# Patient Record
Sex: Female | Born: 1995
Health system: Southern US, Community
[De-identification: ages and names within clinical notes are randomized; demographics above are authoritative.]

## PROBLEM LIST (undated history)

## (undated) DIAGNOSIS — F419 Anxiety disorder, unspecified: Secondary | ICD-10-CM

## (undated) DIAGNOSIS — Z8701 Personal history of pneumonia (recurrent): Secondary | ICD-10-CM

## (undated) DIAGNOSIS — T4145XA Adverse effect of unspecified anesthetic, initial encounter: Secondary | ICD-10-CM

## (undated) DIAGNOSIS — Z9889 Other specified postprocedural states: Secondary | ICD-10-CM

## (undated) DIAGNOSIS — T8859XA Other complications of anesthesia, initial encounter: Secondary | ICD-10-CM

## (undated) DIAGNOSIS — R112 Nausea with vomiting, unspecified: Secondary | ICD-10-CM

## (undated) HISTORY — PX: OTHER SURGICAL HISTORY: SHX169

## (undated) HISTORY — PX: MOUTH SURGERY: SHX715

## (undated) HISTORY — DX: Personal history of pneumonia (recurrent): Z87.01

---

## 2013-06-06 ENCOUNTER — Encounter (HOSPITAL_COMMUNITY): Payer: Self-pay | Admitting: Emergency Medicine

## 2013-06-06 ENCOUNTER — Emergency Department (HOSPITAL_COMMUNITY)
Admission: EM | Admit: 2013-06-06 | Discharge: 2013-06-06 | Disposition: A | Discharge status: Home / Self Care | Payer: Medicaid Other | Attending: Emergency Medicine | Admitting: Emergency Medicine

## 2013-06-06 DIAGNOSIS — Z8719 Personal history of other diseases of the digestive system: Secondary | ICD-10-CM | POA: Insufficient documentation

## 2013-06-06 DIAGNOSIS — Z3202 Encounter for pregnancy test, result negative: Secondary | ICD-10-CM | POA: Insufficient documentation

## 2013-06-06 DIAGNOSIS — B3731 Acute candidiasis of vulva and vagina: Secondary | ICD-10-CM | POA: Insufficient documentation

## 2013-06-06 DIAGNOSIS — Z792 Long term (current) use of antibiotics: Secondary | ICD-10-CM | POA: Insufficient documentation

## 2013-06-06 DIAGNOSIS — J029 Acute pharyngitis, unspecified: Secondary | ICD-10-CM | POA: Insufficient documentation

## 2013-06-06 DIAGNOSIS — B373 Candidiasis of vulva and vagina: Secondary | ICD-10-CM

## 2013-06-06 LAB — POCT PREGNANCY, URINE: Preg Test, Ur: NEGATIVE

## 2013-06-06 LAB — URINALYSIS, ROUTINE W REFLEX MICROSCOPIC
Glucose, UA: NEGATIVE mg/dL
Protein, ur: NEGATIVE mg/dL

## 2013-06-06 LAB — WET PREP, GENITAL
Clue Cells Wet Prep HPF POC: NONE SEEN
Trich, Wet Prep: NONE SEEN

## 2013-06-06 LAB — URINE MICROSCOPIC-ADD ON

## 2013-06-06 MED ORDER — FLUCONAZOLE 150 MG PO TABS
150.0000 mg | ORAL_TABLET | Freq: Once | ORAL | Status: AC
  Administered 2013-06-06: 150 mg via ORAL
  Filled 2013-06-06: qty 1

## 2013-06-06 NOTE — ED Provider Notes (Signed)
CSN: [id number]     Arrival date & time 06/06/13  1833 History   First MD Initiated Contact with Patient 06/06/13 1920     Chief Complaint  Patient presents with  . Vaginal Itching    HPI  Christina Marshall is a 17 y.o. female with a no PMH who presents to the ED for evaluation of vaginal itching.  History was provided by the patient.  Patient states that she has had vaginal itching for the last 5 days and discharge which began this morning.  She describes thick white "chunky" discharge. She also reports dysuria, but believes it is due to her itching and discomfort.  She denies urinary frequency or hematuria. She is currently sexually active.  No hx of STI or pregnancy. She denies any abdominal pain, nausea, emesis, diarrhea, constipation, vaginal bleeding, or genital sores.  Her LNMP was 05/13/13.  She is not on any means of contraception. Patient states she is on antibiotics for "enlarged tonsils."  She has been on amoxicillin for the past "month" (September 19th?).  She also was on clindamycin a month ago but was taken off of it due to GI discomfort.  She is following up with an ENT back home.  She is currently at student at [employer]. She otherwise has been well with no fevers, chills, change in appetite/activity.  Her sore throat is much improved after taking antibiotics.       History reviewed. No pertinent past medical history. History reviewed. No pertinent past surgical history. History reviewed. No pertinent family history. History  Substance Use Topics  . Smoking status: Never Smoker   . Smokeless tobacco: Not on file  . Alcohol Use: No   OB History   Grav Para Term Preterm Abortions TAB SAB Ect Mult Living                 Review of Systems  Constitutional: Negative for fever, chills, activity change, appetite change and fatigue.  HENT: Positive for sore throat (improvied). Negative for congestion, ear pain, mouth sores, rhinorrhea, trouble swallowing and voice  change.   Eyes: Negative for visual disturbance.  Respiratory: Negative for cough and shortness of breath.   Cardiovascular: Negative for chest pain and leg swelling.  Gastrointestinal: Negative for nausea, vomiting, abdominal pain, diarrhea and constipation.  Endocrine: Negative for polyuria.  Genitourinary: Positive for dysuria and vaginal discharge. Negative for urgency, frequency, hematuria, decreased urine volume, vaginal bleeding, difficulty urinating, vaginal pain and pelvic pain.  Musculoskeletal: Negative for back pain.  Skin: Negative for color change.  Neurological: Negative for dizziness, syncope, weakness, light-headedness, numbness and headaches.  Psychiatric/Behavioral: Negative for confusion.    Allergies  Review of patient's allergies indicates no known allergies.  Home Medications   Current Outpatient Rx  Name  Route  Sig  Dispense  Refill  . amoxicillin (AMOXIL) 500 MG capsule   Oral   Take 500 mg by mouth 3 (three) times daily.         Marland Kitchen omeprazole (PRILOSEC) 20 MG capsule   Oral   Take 20 mg by mouth daily.          BP 138/74  Pulse 112  Temp(Src) 98.7 F (37.1 C) (Oral)  Resp 18  SpO2 98%  LMP 05/13/2013  Filed Vitals:   06/06/13 1843 06/06/13 2021 06/06/13 2131  BP: 138/74 121/94 106/71  Pulse: 112 89 94  Temp: 98.7 F (37.1 C)    TempSrc: Oral    Resp: 18 15 16  SpO2: 98% 100% 100%    Physical Exam  Nursing note and vitals reviewed. Constitutional: She is oriented to person, place, and time. She appears well-developed and well-nourished. No distress.  HENT:  Head: Normocephalic and atraumatic.  Right Ear: External ear normal.  Left Ear: External ear normal.  Nose: Nose normal.  Mouth/Throat: No oropharyngeal exudate.  Tonsils 3+ bilaterally (L>R).  No exudates or erythema. No trismus. Uvula midline.   Eyes: Conjunctivae are normal. Pupils are equal, round, and reactive to light. Right eye exhibits no discharge. Left eye exhibits no  discharge.  Neck: Normal range of motion. Neck supple.  Cardiovascular: Normal rate, regular rhythm, normal heart sounds and intact distal pulses.  Exam reveals no gallop and no friction rub.   No murmur heard. Pulmonary/Chest: Effort normal and breath sounds normal. No respiratory distress. She has no wheezes. She has no rales. She exhibits no tenderness.  Abdominal: Soft. Bowel sounds are normal. She exhibits no distension and no mass. There is no tenderness. There is no rebound and no guarding.  Musculoskeletal: Normal range of motion. She exhibits no edema and no tenderness.  No CVA or lumbar spinal tenderness bilaterally  Neurological: She is alert and oriented to person, place, and time.  Skin: Skin is warm and dry. She is not diaphoretic.    ED Course  Procedures (including critical care time) Labs Review Labs Reviewed  URINALYSIS, ROUTINE W REFLEX MICROSCOPIC - Abnormal; Notable for the following:    APPearance CLOUDY (*)    Leukocytes, UA LARGE (*)    All other components within normal limits  URINE MICROSCOPIC-ADD ON  POCT PREGNANCY, URINE   Imaging Review No results found.  EKG Interpretation   None      Results for orders placed during the hospital encounter of 06/06/13  GC/CHLAMYDIA PROBE AMP      Result Value Range   CT Probe RNA NEGATIVE  NEGATIVE   GC Probe RNA NEGATIVE  NEGATIVE  WET PREP, GENITAL      Result Value Range   Yeast Wet Prep HPF POC MANY (*) NONE SEEN   Trich, Wet Prep NONE SEEN  NONE SEEN   Clue Cells Wet Prep HPF POC NONE SEEN  NONE SEEN   WBC, Wet Prep HPF POC TOO NUMEROUS TO COUNT (*) NONE SEEN  URINALYSIS, ROUTINE W REFLEX MICROSCOPIC      Result Value Range   Color, Urine YELLOW  YELLOW   APPearance CLOUDY (*) CLEAR   Specific Gravity, Urine 1.022  1.005 - 1.030   pH 6.0  5.0 - 8.0   Glucose, UA NEGATIVE  NEGATIVE mg/dL   Hgb urine dipstick NEGATIVE  NEGATIVE   Bilirubin Urine NEGATIVE  NEGATIVE   Ketones, ur NEGATIVE  NEGATIVE  mg/dL   Protein, ur NEGATIVE  NEGATIVE mg/dL   Urobilinogen, UA 0.2  0.0 - 1.0 mg/dL   Nitrite NEGATIVE  NEGATIVE   Leukocytes, UA LARGE (*) NEGATIVE  URINE MICROSCOPIC-ADD ON      Result Value Range   Squamous Epithelial / LPF RARE  RARE   WBC, UA 0-2  <3 WBC/hpf   Urine-Other RARE YEAST    RPR      Result Value Range   RPR NON REACTIVE  NON REACTIVE  HIV ANTIBODY (ROUTINE TESTING)      Result Value Range   HIV NON REACTIVE  NON REACTIVE  POCT PREGNANCY, URINE      Result Value Range   Preg Test, Ur NEGATIVE  NEGATIVE  MDM  No diagnosis found.  Yamilett Anastos is a 17 y.o. female with a no PMH who presents to the ED for evaluation of vaginal itching.  UA and urine pregnancy ordered to further evaluate.      Rechecks  8:32 PM = Pelvic exam performed at bedside with [facility] tech present.  Moderate amount of yellow thick discharge present in the vaginal vault. Cervix with erythema throughout. Patient had no CMT or adnexal tenderness.  Wet mount and GC sent for evaluation.     Etiology of vaginal discharge is likely due to a yeast infection from antibiotic use.  Patient was given diflucan in the ED.  She was encouraged to follow-up with an OB/GYN for PAP smear testing and further management.  She was not treated for a UTI at this time.  I believe her dysuria is due to her yeast infection.  Urine sent for culture. She had no CMT, her abdominal exam was benign, she was afebrile, and non-toxic in appearance. She had low suspicion for STI.  HIV, RPR, and GC results are pending.  I did not feel she needed prophylaxis STI tx at this time. Patient instructed to call ENT tomorrow inform them of her ED visit and ask how long she should continue to take the antbiiotics for.  Patient was instructed to return to the ED if they experience any severe/worsening abdominal pain, fever, back pain, repeated vomiting, or other concerns.  Patient was in agreement with discharge and plan.     Final  impressions: 1. Vaginal yeast infection     Greer Ee Marisela Line PA-C        Jillyn Ledger, PA-C 06/07/13 1350

## 2013-06-06 NOTE — ED Notes (Addendum)
Pt c/o vaginal itching, soreness, with white "chunky" discharge.

## 2013-06-07 LAB — GC/CHLAMYDIA PROBE AMP
CT Probe RNA: NEGATIVE
GC Probe RNA: NEGATIVE

## 2013-06-07 LAB — HIV ANTIBODY (ROUTINE TESTING W REFLEX): HIV: NONREACTIVE

## 2013-06-07 NOTE — ED Provider Notes (Signed)
Medical screening examination/treatment/procedure(s) were performed by non-physician practitioner and as supervising physician I was immediately available for consultation/collaboration.  Neylan Koroma R. Ibraheem Voris, MD 06/07/13 1511 

## 2013-06-08 LAB — URINE CULTURE: Culture: NO GROWTH

## 2016-12-25 ENCOUNTER — Ambulatory Visit: Payer: Self-pay | Admitting: Physician Assistant

## 2016-12-25 ENCOUNTER — Encounter: Payer: Self-pay | Admitting: Physician Assistant

## 2016-12-25 ENCOUNTER — Encounter: Payer: Self-pay | Admitting: *Deleted

## 2016-12-25 VITALS — BP 100/80 | HR 90 | Temp 99.2°F

## 2016-12-25 DIAGNOSIS — R221 Localized swelling, mass and lump, neck: Secondary | ICD-10-CM

## 2016-12-25 MED ORDER — SULFAMETHOXAZOLE-TRIMETHOPRIM 800-160 MG PO TABS: 1.0000 | ORAL_TABLET | Freq: Two times a day (BID) | ORAL | 0 refills | Status: DC

## 2016-12-25 MED ORDER — FLUCONAZOLE 150 MG PO TABS: ORAL_TABLET | ORAL | 0 refills | Status: DC

## 2016-12-25 NOTE — Progress Notes (Signed)
S: c/o knot on back of neck, has gotten larger in last few weeks, been there for about a year, low grade temp today, also feels tired all of the time, no v/d, no drainage from site, never pops like a pimple per patient  O: vitals wnl, nad, skin with slight warmth at site, tangerine sized mass on r side of posterior neck, is a little fluctuant but feels encapsulated, no pustules or drainage, n/v intact, lungs c t a, cv rrr  A: mass on neck  P: septra ds, diflucan, will refer to surgeon for eval

## 2016-12-26 NOTE — Progress Notes (Signed)
Spoke with [facility] Surgical appt scheduled with Dr. Jamal Collin on 01/06/2017 @ 1:00. Patient was informed will be out of town from 5/6 thru 5/13.

## 2017-01-06 ENCOUNTER — Encounter: Payer: Self-pay | Admitting: General Surgery

## 2017-01-06 ENCOUNTER — Ambulatory Visit (INDEPENDENT_AMBULATORY_CARE_PROVIDER_SITE_OTHER): Payer: BLUE CROSS/BLUE SHIELD | Admitting: General Surgery

## 2017-01-06 VITALS — BP 112/74 | HR 77 | Resp 14 | Ht 65.0 in | Wt 155.0 lb

## 2017-01-06 DIAGNOSIS — D170 Benign lipomatous neoplasm of skin and subcutaneous tissue of head, face and neck: Secondary | ICD-10-CM

## 2017-01-06 NOTE — Patient Instructions (Signed)
Lipoma A lipoma is a noncancerous (benign) tumor that is made up of fat cells. This is a very common type of soft-tissue growth. Lipomas are usually found under the skin (subcutaneous). They may occur in any tissue of the body that contains fat. Common areas for lipomas to appear include the back, shoulders, buttocks, and thighs. Lipomas grow slowly, and they are usually painless. Most lipomas do not cause problems and do not require treatment. What are the causes? The cause of this condition is not known. What increases the risk? This condition is more likely to develop in:  People who are 85-19 years old.  People who have a family history of lipomas. What are the signs or symptoms? A lipoma usually appears as a small, round bump under the skin. It may feel soft or rubbery, but the firmness can vary. Most lipomas are not painful. However, a lipoma may become painful if it is located in an area where it pushes on nerves. How is this diagnosed? A lipoma can usually be diagnosed with a physical exam. You may also have tests to confirm the diagnosis and to rule out other conditions. Tests may include:  Imaging tests, such as a CT scan or MRI.  Removal of a tissue sample to be looked at under a microscope (biopsy). How is this treated? Treatment is not needed for small lipomas that are not causing problems. If a lipoma continues to get bigger or it causes problems, removal is often the best option. Lipomas can also be removed to improve appearance. Removal of a lipoma is usually done with a surgery in which the fatty cells and the surrounding capsule are removed. Most often, a medicine that numbs the area (local anesthetic) is used for this procedure. Follow these instructions at home:  Keep all follow-up visits as directed by your health care provider. This is important. Contact a health care provider if:  Your lipoma becomes larger or hard.  Your lipoma becomes painful, red, or increasingly  swollen. These could be signs of infection or a more serious condition. This information is not intended to replace advice given to you by your health care provider. Make sure you discuss any questions you have with your health care provider. Document Released: 08/02/2002 Document Revised: 01/18/2016 Document Reviewed: 08/08/2014 Elsevier Interactive Patient Education  2017 [employer].

## 2017-01-06 NOTE — Progress Notes (Signed)
Patient ID: Christina Marshall, female   DOB: 1995/12/01, 21 y.o.   MRN: [id number]  Chief Complaint  Patient presents with  . Lipoma    HPI Christina Marshall is a 21 y.o. female here for evaluation of a lump on the back of her neck. It has been there for about two years. Recently it has been causing discomfort in the back of her neck. It also seems to have gotten larger. She reports that it can change sizes. She is currently on Bactrim DS for preventative by her PCP.  HPI  Past Medical History:  Diagnosis Date  . History of pneumonia as a child    Past Surgical History:  Procedure Laterality Date  . ear cyst removal Left     Family History  Problem Relation Age of Onset  . Bipolar disorder Mother     Social History Social History  Substance Use Topics  . Smoking status: Never Smoker  . Smokeless tobacco: Never Used  . Alcohol use No    No Known Allergies  Current Outpatient Prescriptions  Medication Sig Dispense Refill  . Multiple Vitamin (MULTIVITAMIN) tablet Take 1 tablet by mouth daily.    Marland Kitchen sulfamethoxazole-trimethoprim (BACTRIM DS,SEPTRA DS) 800-160 MG tablet Take 1 tablet by mouth 2 (two) times daily.  0   No current facility-administered medications for this visit.     Review of Systems Review of Systems  Constitutional: Negative.   Respiratory: Negative.   Cardiovascular: Negative.     Blood pressure 112/74, pulse 77, resp. rate 14, height 5\' 5"  (1.651 m), weight 155 lb (70.3 kg), last menstrual period 12/29/2016.  Physical Exam Physical Exam  Constitutional: She is oriented to person, place, and time. She appears well-developed and well-nourished.  Eyes: Conjunctivae are normal. No scleral icterus.  Neck: Neck supple.  7 cm x 5 cm lipoma right lower posterior neck    Cardiovascular: Normal rate, regular rhythm and normal heart sounds.   Pulmonary/Chest: Effort normal and breath sounds normal.  Lymphadenopathy:    She has no cervical  adenopathy.  Neurological: She is alert and oriented to person, place, and time.  Skin: Skin is warm and dry.  Psychiatric: She has a normal mood and affect.    Data Reviewed    Assessment    Large symptomatic mass base of neck, likely a lipoma.    Plan   Recommended excision in SDS and pt is agreeable. Procedure, risks/benefits explained.   Schedule surgical removal of lipoma of the neck    HPI, Physical Exam, Assessment and Plan have been scribed under the direction and in the presence of Mckinley Jewel, MD  Concepcion Living, LPN  I have completed the exam and reviewed the above documentation for accuracy and completeness.  I agree with the above.  Haematologist has been used and any errors in dictation or transcription are unintentional.  Annaelle Kasel G. Jamal Collin, M.D., F.A.C.S.   Junie Panning G 01/06/2017, 1:54 PM  Patient's surgery has been scheduled for 01-28-17 at [facility].   Dominga Ferry, CMA

## 2017-01-07 ENCOUNTER — Encounter: Payer: Self-pay | Admitting: General Surgery

## 2017-01-14 ENCOUNTER — Other Ambulatory Visit: Payer: Self-pay | Admitting: General Surgery

## 2017-01-14 ENCOUNTER — Inpatient Hospital Stay: Admission: RE | Admit: 2017-01-14 | Payer: Self-pay | Source: Ambulatory Visit

## 2017-01-14 DIAGNOSIS — R221 Localized swelling, mass and lump, neck: Secondary | ICD-10-CM

## 2017-01-15 ENCOUNTER — Inpatient Hospital Stay: Admission: RE | Admit: 2017-01-15 | Payer: Self-pay | Source: Ambulatory Visit

## 2017-01-16 ENCOUNTER — Inpatient Hospital Stay: Admission: RE | Admit: 2017-01-16 | Payer: Self-pay | Source: Ambulatory Visit

## 2017-01-17 ENCOUNTER — Inpatient Hospital Stay: Admission: RE | Admit: 2017-01-17 | Payer: Self-pay | Source: Ambulatory Visit

## 2017-01-21 ENCOUNTER — Encounter
Admission: RE | Admit: 2017-01-21 | Discharge: 2017-01-21 | Disposition: A | Discharge status: Home / Self Care | Payer: BLUE CROSS/BLUE SHIELD | Source: Ambulatory Visit | Attending: General Surgery | Admitting: General Surgery

## 2017-01-21 HISTORY — DX: Anxiety disorder, unspecified: F41.9

## 2017-01-21 HISTORY — DX: Nausea with vomiting, unspecified: R11.2

## 2017-01-21 HISTORY — DX: Other specified postprocedural states: Z98.890

## 2017-01-21 HISTORY — DX: Adverse effect of unspecified anesthetic, initial encounter: T41.45XA

## 2017-01-21 HISTORY — DX: Other complications of anesthesia, initial encounter: T88.59XA

## 2017-01-21 NOTE — Patient Instructions (Signed)
  Your procedure is scheduled on: 01-28-17 Report to Same Day Surgery 2nd floor medical mall [facility] Entrance-take elevator on left to 2nd floor.  Check in with surgery information desk.) To find out your arrival time please call [phone] between 1PM - 3PM on 01-27-17  Remember: Instructions that are not followed completely may result in serious medical risk, up to and including death, or upon the discretion of your surgeon and anesthesiologist your surgery may need to be rescheduled.    _x___ 1. Do not eat food or drink liquids after midnight. No gum chewing or hard candies.     __x__ 2. No Alcohol for 24 hours before or after surgery.   __x__3. No Smoking for 24 prior to surgery.   ____  4. Bring all medications with you on the day of surgery if instructed.    __x__ 5. Notify your doctor if there is any change in your medical condition     (cold, fever, infections).     Do not wear jewelry, make-up, hairpins, clips or nail polish.  Do not wear lotions, powders, or perfumes. You may wear deodorant.  Do not shave 48 hours prior to surgery. Men may shave face and neck.  Do not bring valuables to the hospital.    [facility] is not responsible for any belongings or valuables.               Contacts, dentures or bridgework may not be worn into surgery.  Leave your suitcase in the car. After surgery it may be brought to your room.  For patients admitted to the hospital, discharge time is determined by your                       treatment team.   Patients discharged the day of surgery will not be allowed to drive home.  You will need someone to drive you home and stay with you the night of your procedure.    Please read over the following fact sheets that you were given:   [facility] Preparing for Surgery and or MRSA Information   ____ Take anti-hypertensive (unless it includes a diuretic), cardiac, seizure, asthma,     anti-reflux and psychiatric medicines. These include:  1.  NONE  2.  3.  4.  5.  6.  ____Fleets enema or Magnesium Citrate as directed.   ____ Use CHG Soap or sage wipes as directed on instruction sheet   ____ Use inhalers on the day of surgery and bring to hospital day of surgery  ____ Stop Metformin and Janumet 2 days prior to surgery.    ____ Take 1/2 of usual insulin dose the night before surgery and none on the morning surgery.   ____ Follow recommendations from Cardiologist, Pulmonologist or PCP regarding  stopping Aspirin, Coumadin, Pllavix ,Eliquis, Effient, or Pradaxa, and Pletal.  X____Stop Anti-inflammatories such as Advil, Aleve, Ibuprofen, Motrin, Naproxen, Naprosyn, Goodies powders or aspirin products NOW- OK to take Tylenol    ____ Stop supplements until after surgery.     ____ Bring C-Pap to the hospital.

## 2017-01-28 ENCOUNTER — Encounter: Payer: Self-pay | Admitting: *Deleted

## 2017-01-28 ENCOUNTER — Ambulatory Visit: Payer: BLUE CROSS/BLUE SHIELD | Admitting: Anesthesiology

## 2017-01-28 ENCOUNTER — Ambulatory Visit
Admission: RE | Admit: 2017-01-28 | Discharge: 2017-01-28 | Disposition: A | Discharge status: Home / Self Care | Payer: BLUE CROSS/BLUE SHIELD | Source: Ambulatory Visit | Attending: General Surgery | Admitting: General Surgery

## 2017-01-28 ENCOUNTER — Encounter
Admission: RE | Disposition: A | Discharge status: Home / Self Care | Payer: Self-pay | Source: Ambulatory Visit | Attending: General Surgery

## 2017-01-28 DIAGNOSIS — R221 Localized swelling, mass and lump, neck: Secondary | ICD-10-CM

## 2017-01-28 DIAGNOSIS — D170 Benign lipomatous neoplasm of skin and subcutaneous tissue of head, face and neck: Secondary | ICD-10-CM

## 2017-01-28 HISTORY — PX: EXCISION MASS NECK: SHX6703

## 2017-01-28 LAB — POCT PREGNANCY, URINE: Preg Test, Ur: NEGATIVE

## 2017-01-28 SURGERY — EXCISION, MASS, NECK
Anesthesia: General | Wound class: Clean

## 2017-01-28 MED ORDER — PROPOFOL 10 MG/ML IV BOLUS
INTRAVENOUS | Status: AC
  Filled 2017-01-28: qty 20

## 2017-01-28 MED ORDER — TRAMADOL HCL 50 MG PO TABS: 50.0000 mg | ORAL_TABLET | Freq: Four times a day (QID) | ORAL | 0 refills | Status: DC | PRN

## 2017-01-28 MED ORDER — FAMOTIDINE 20 MG PO TABS
20.0000 mg | ORAL_TABLET | Freq: Once | ORAL | Status: AC
  Administered 2017-01-28: 20 mg via ORAL

## 2017-01-28 MED ORDER — PROPOFOL 10 MG/ML IV BOLUS
INTRAVENOUS | Status: DC | PRN
  Administered 2017-01-28: 140 mg via INTRAVENOUS

## 2017-01-28 MED ORDER — DEXAMETHASONE SODIUM PHOSPHATE 10 MG/ML IJ SOLN
INTRAMUSCULAR | Status: AC
  Filled 2017-01-28: qty 1

## 2017-01-28 MED ORDER — KETOROLAC TROMETHAMINE 30 MG/ML IJ SOLN
INTRAMUSCULAR | Status: DC | PRN
  Administered 2017-01-28: 30 mg via INTRAVENOUS

## 2017-01-28 MED ORDER — FAMOTIDINE 20 MG PO TABS
ORAL_TABLET | ORAL | Status: AC
  Filled 2017-01-28: qty 1

## 2017-01-28 MED ORDER — BUPIVACAINE HCL (PF) 0.5 % IJ SOLN
INTRAMUSCULAR | Status: AC
  Filled 2017-01-28: qty 30

## 2017-01-28 MED ORDER — CHLORHEXIDINE GLUCONATE CLOTH 2 % EX PADS
6.0000 | MEDICATED_PAD | Freq: Once | CUTANEOUS | Status: AC
  Administered 2017-01-28: 6 via TOPICAL

## 2017-01-28 MED ORDER — ONDANSETRON HCL 4 MG/2ML IJ SOLN
INTRAMUSCULAR | Status: DC | PRN
  Administered 2017-01-28: 4 mg via INTRAVENOUS

## 2017-01-28 MED ORDER — FENTANYL CITRATE (PF) 100 MCG/2ML IJ SOLN
25.0000 ug | INTRAMUSCULAR | Status: DC | PRN
  Administered 2017-01-28 (×2): 25 ug via INTRAVENOUS

## 2017-01-28 MED ORDER — BUPIVACAINE HCL (PF) 0.5 % IJ SOLN
INTRAMUSCULAR | Status: DC | PRN
  Administered 2017-01-28: 16 mL

## 2017-01-28 MED ORDER — FENTANYL CITRATE (PF) 100 MCG/2ML IJ SOLN
INTRAMUSCULAR | Status: DC | PRN
Start: 2017-01-28 — End: 2017-01-28
  Administered 2017-01-28: 50 ug via INTRAVENOUS

## 2017-01-28 MED ORDER — ONDANSETRON HCL 4 MG/2ML IJ SOLN
INTRAMUSCULAR | Status: AC
  Filled 2017-01-28: qty 2

## 2017-01-28 MED ORDER — ROCURONIUM BROMIDE 100 MG/10ML IV SOLN
INTRAVENOUS | Status: DC | PRN
  Administered 2017-01-28: 30 mg via INTRAVENOUS

## 2017-01-28 MED ORDER — ONDANSETRON HCL 4 MG/2ML IJ SOLN
4.0000 mg | Freq: Once | INTRAMUSCULAR | Status: AC | PRN
  Administered 2017-01-28: 4 mg via INTRAVENOUS

## 2017-01-28 MED ORDER — MIDAZOLAM HCL 2 MG/2ML IJ SOLN
INTRAMUSCULAR | Status: AC
  Filled 2017-01-28: qty 2

## 2017-01-28 MED ORDER — LIDOCAINE HCL (CARDIAC) 20 MG/ML IV SOLN
INTRAVENOUS | Status: DC | PRN
  Administered 2017-01-28: 80 mg via INTRAVENOUS

## 2017-01-28 MED ORDER — FENTANYL CITRATE (PF) 100 MCG/2ML IJ SOLN
INTRAMUSCULAR | Status: AC
  Filled 2017-01-28: qty 2

## 2017-01-28 MED ORDER — DEXAMETHASONE SODIUM PHOSPHATE 10 MG/ML IJ SOLN
INTRAMUSCULAR | Status: DC | PRN
  Administered 2017-01-28: 10 mg via INTRAVENOUS

## 2017-01-28 MED ORDER — PHENYLEPHRINE HCL 10 MG/ML IJ SOLN
INTRAMUSCULAR | Status: DC | PRN
  Administered 2017-01-28: 100 ug via INTRAVENOUS

## 2017-01-28 MED ORDER — MIDAZOLAM HCL 2 MG/2ML IJ SOLN
INTRAMUSCULAR | Status: DC | PRN
  Administered 2017-01-28: 2 mg via INTRAVENOUS

## 2017-01-28 MED ORDER — SUGAMMADEX SODIUM 200 MG/2ML IV SOLN
INTRAVENOUS | Status: DC | PRN
  Administered 2017-01-28: 140 mg via INTRAVENOUS

## 2017-01-28 MED ORDER — LACTATED RINGERS IV SOLN
INTRAVENOUS | Status: DC
  Administered 2017-01-28 (×2): via INTRAVENOUS

## 2017-01-28 SURGICAL SUPPLY — 25 items
CANISTER SUCT 1200ML W/VALVE (MISCELLANEOUS) ×3 IMPLANT
CHLORAPREP W/TINT 26ML (MISCELLANEOUS) ×3 IMPLANT
DERMABOND ADVANCED (GAUZE/BANDAGES/DRESSINGS) ×2
DERMABOND ADVANCED .7 DNX12 (GAUZE/BANDAGES/DRESSINGS) ×1 IMPLANT
DRAPE LAPAROTOMY 100X77 ABD (DRAPES) ×3 IMPLANT
DRAPE LAPAROTOMY 77X122 PED (DRAPES) ×3 IMPLANT
ELECT REM PT RETURN 9FT ADLT (ELECTROSURGICAL) ×3
ELECTRODE REM PT RTRN 9FT ADLT (ELECTROSURGICAL) ×1 IMPLANT
GLOVE BIO SURGEON STRL SZ7 (GLOVE) ×15 IMPLANT
GOWN STRL REUS W/ TWL LRG LVL3 (GOWN DISPOSABLE) ×2 IMPLANT
GOWN STRL REUS W/TWL LRG LVL3 (GOWN DISPOSABLE) ×4
KIT RM TURNOVER STRD PROC AR (KITS) ×3 IMPLANT
LABEL OR SOLS (LABEL) ×6 IMPLANT
NEEDLE HYPO 25X1 1.5 SAFETY (NEEDLE) ×3 IMPLANT
PACK BASIN MINOR ARMC (MISCELLANEOUS) ×3 IMPLANT
SUT ETHILON 4-0 (SUTURE)
SUT ETHILON 4-0 FS2 18XMFL BLK (SUTURE)
SUT VIC AB 2-0 CT1 (SUTURE) ×3 IMPLANT
SUT VIC AB 3-0 SH 27 (SUTURE) ×2
SUT VIC AB 3-0 SH 27X BRD (SUTURE) ×1 IMPLANT
SUT VIC AB 4-0 FS2 27 (SUTURE) ×3 IMPLANT
SUT VICRYL+ 3-0 144IN (SUTURE) ×3 IMPLANT
SUTURE ETHLN 4-0 FS2 18XMF BLK (SUTURE) IMPLANT
SYR BULB IRRIG 60ML STRL (SYRINGE) ×3 IMPLANT
SYR CONTROL 10ML (SYRINGE) ×3 IMPLANT

## 2017-01-28 NOTE — Anesthesia Post-op Follow-up Note (Cosign Needed)
Anesthesia QCDR form completed.        

## 2017-01-28 NOTE — OR Nursing (Signed)
Dr Jamal Collin by and said the incision was approximated well and skin glue intact

## 2017-01-28 NOTE — Transfer of Care (Signed)
Immediate Anesthesia Transfer of Care Note  Patient: Christina Marshall  Procedure(s) Performed: Procedure(s): EXCISION MASS NECK (N/A)  Patient Location: PACU  Anesthesia Type:General  Level of Consciousness: drowsy and patient cooperative  Airway & Oxygen Therapy: Patient Spontanous Breathing and Patient connected to face mask oxygen  Post-op Assessment: Report given to RN and Post -op Vital signs reviewed and stable  Post vital signs: Reviewed and stable  Last Vitals:  Vitals:   01/28/17 0649 01/28/17 0905  BP: (!) 134/98 122/78  Pulse: 83 89  Resp: 20 19  Temp: 37 C 36.3 C    Last Pain:  Vitals:   01/28/17 0649  TempSrc: Oral         Complications: No apparent anesthesia complications

## 2017-01-28 NOTE — Interval H&P Note (Signed)
History and Physical Interval Note:  01/28/2017 7:04 AM  Christina Marshall  has presented today for surgery, with the diagnosis of LIPOMA BASE OF NECK  The various methods of treatment have been discussed with the patient and family. After consideration of risks, benefits and other options for treatment, the patient has consented to  Procedure(s): EXCISION MASS NECK (N/A) as a surgical intervention .  The patient's history has been reviewed, patient examined, no change in status, stable for surgery.  I have reviewed the patient's chart and labs.  Questions were answered to the patient's satisfaction.     [name]

## 2017-01-28 NOTE — Discharge Instructions (Signed)

## 2017-01-28 NOTE — H&P (View-Only) (Signed)
Patient ID: Christina Marshall, female   DOB: [birth year], 21 y.o.   MRN: [id number]  Chief Complaint  Patient presents with  . Lipoma    HPI [name] is a 21 y.o. female here for evaluation of a lump on the back of her neck. It has been there for about two years. Recently it has been causing discomfort in the back of her neck. It also seems to have gotten larger. She reports that it can change sizes. She is currently on Bactrim DS for preventative by her PCP.  HPI  Past Medical History:  Diagnosis Date  . History of pneumonia as a child    Past Surgical History:  Procedure Laterality Date  . ear cyst removal Left     Family History  Problem Relation Age of Onset  . Bipolar disorder Mother     Social History Social History  Substance Use Topics  . Smoking status: Never Smoker  . Smokeless tobacco: Never Used  . Alcohol use No    No Known Allergies  Current Outpatient Prescriptions  Medication Sig Dispense Refill  . Multiple Vitamin (MULTIVITAMIN) tablet Take 1 tablet by mouth daily.    Marland Kitchen sulfamethoxazole-trimethoprim (BACTRIM DS,SEPTRA DS) 800-160 MG tablet Take 1 tablet by mouth 2 (two) times daily.  0   No current facility-administered medications for this visit.     Review of Systems Review of Systems  Constitutional: Negative.   Respiratory: Negative.   Cardiovascular: Negative.     Blood pressure 112/74, pulse 77, resp. rate 14, height 5\' 5"  (1.651 m), weight 155 lb (70.3 kg), last menstrual period 12/29/2016.  Physical Exam Physical Exam  Constitutional: She is oriented to person, place, and time. She appears well-developed and well-nourished.  Eyes: Conjunctivae are normal. No scleral icterus.  Neck: Neck supple.  7 cm x 5 cm lipoma right lower posterior neck    Cardiovascular: Normal rate, regular rhythm and normal heart sounds.   Pulmonary/Chest: Effort normal and breath sounds normal.  Lymphadenopathy:    She has no cervical  adenopathy.  Neurological: She is alert and oriented to person, place, and time.  Skin: Skin is warm and dry.  Psychiatric: She has a normal mood and affect.    Data Reviewed    Assessment    Large symptomatic mass base of neck, likely a lipoma.    Plan   Recommended excision in SDS and pt is agreeable. Procedure, risks/benefits explained.   Schedule surgical removal of lipoma of the neck    HPI, Physical Exam, Assessment and Plan have been scribed under the direction and in the presence of Mckinley Jewel, MD  Concepcion Living, LPN  I have completed the exam and reviewed the above documentation for accuracy and completeness.  I agree with the above.  Haematologist has been used and any errors in dictation or transcription are unintentional.  Seeplaputhur G. Jamal Collin, M.D., F.A.C.S.   Junie Panning G 01/06/2017, 1:54 PM  Patient's surgery has been scheduled for 01-28-17 at [facility].   Dominga Ferry, CMA

## 2017-01-28 NOTE — Anesthesia Postprocedure Evaluation (Signed)
Anesthesia Post Note  Patient: Christina Marshall  Procedure(s) Performed: Procedure(s) (LRB): EXCISION MASS NECK (N/A)  Patient location during evaluation: PACU Anesthesia Type: General Level of consciousness: awake and alert Pain management: pain level controlled Vital Signs Assessment: post-procedure vital signs reviewed and stable Respiratory status: spontaneous breathing, nonlabored ventilation, respiratory function stable and patient connected to nasal cannula oxygen Cardiovascular status: blood pressure returned to baseline and stable Postop Assessment: no signs of nausea or vomiting Anesthetic complications: no     Last Vitals:  Vitals:   01/28/17 1022 01/28/17 1115  BP: 130/77 102/67  Pulse: 70 67  Resp: 16 16  Temp: 36.3 C     Last Pain:  Vitals:   01/28/17 1115  TempSrc:   PainSc: [name]

## 2017-01-28 NOTE — OR Nursing (Signed)
Pt continues to c/o nausea has had four packages of saltines and two cups of ginger ale

## 2017-01-28 NOTE — OR Nursing (Signed)
Upon pt arrival in post op Dr Jamal Collin notified in OR to examine incision before pt discharged to home

## 2017-01-28 NOTE — Op Note (Signed)
Preop diagnosis: Lipoma base of the neck posteriorly  Post op diagnosis: Same    Operation: Excision lipoma base of neck  Surgeon: Mckinley Jewel   Assistant:   Anesthesia: Gen.  Complications: None  EBL: Minimal  Drains: None  Description: Patient was put to sleep with an endotracheal tube and then placed on the left lateral position held in place with a beanbag. The mass in question was located the base of the neck to the right of the midline. This area was satisfactorily exposed and prepped and draped as sterile field. Timeout was performed 15 mL of 0.5% Marcaine was instilled for postop analgesia. A transverse incision about the 1 inch in length was made over the middle of the mass. This was then deepened through the superficial fascia was opened to reveal a lobulated lipomatous mass. This was then circumferentially freed and dissected down to the area of the fascia covering the muscle. Couple places the lipoma was partially infiltrating through the fascia into the muscle but then was freed easily with the use of cautery. After this was completely freed the lipoma was removed and measured about 7 x 5 cm in size. This was sent in formalin for pathology. The 2 areas where the lipoma was firmly adherent to the fascia and the muscle was inspected and small bleeding points were cauterized. After ensuring hemostasis the wound was irrigated and closed. The subcutaneous tissue was approximated with interrupted 2-0 Vicryl. The skin was closed with subcuticular 4-0 Vicryl covered with Dermabond. Procedure was well-tolerated and patient subsequently returned recovery room stable condition

## 2017-01-28 NOTE — Anesthesia Preprocedure Evaluation (Signed)
Anesthesia Evaluation  Patient identified by MRN, date of birth, ID band Patient awake    Reviewed: Allergy & Precautions, NPO status , Patient's Chart, lab work & pertinent test results, reviewed documented beta blocker date and time   History of Anesthesia Complications (+) PONV and history of anesthetic complications  Airway Mallampati: II  TM Distance: >3 FB     Dental  (+) Chipped   Pulmonary           Cardiovascular      Neuro/Psych Anxiety    GI/Hepatic   Endo/Other    Renal/GU      Musculoskeletal   Abdominal   Peds  Hematology   Anesthesia Other Findings   Reproductive/Obstetrics                             Anesthesia Physical Anesthesia Plan  ASA: II  Anesthesia Plan: General   Post-op Pain Management:    Induction: Intravenous  PONV Risk Score and Plan:   Airway Management Planned: Oral ETT  Additional Equipment:   Intra-op Plan:   Post-operative Plan:   Informed Consent: I have reviewed the patients History and Physical, chart, labs and discussed the procedure including the risks, benefits and alternatives for the proposed anesthesia with the patient or authorized representative who has indicated his/her understanding and acceptance.     Plan Discussed with: CRNA  Anesthesia Plan Comments:         Anesthesia Quick Evaluation

## 2017-01-28 NOTE — Anesthesia Procedure Notes (Signed)
Procedure Name: Intubation Date/Time: 01/28/2017 8:07 AM Performed by: Silvana Newness Pre-anesthesia Checklist: Patient identified, Emergency Drugs available, Suction available, Patient being monitored and Timeout performed Patient Re-evaluated:Patient Re-evaluated prior to inductionOxygen Delivery Method: Circle system utilized Preoxygenation: Pre-oxygenation with 100% oxygen Intubation Type: IV induction Ventilation: Mask ventilation without difficulty Laryngoscope Size: Mac and 3 Grade View: Grade I Tube type: Oral Tube size: 7.0 mm Number of attempts: 1 Airway Equipment and Method: Stylet Placement Confirmation: ETT inserted through vocal cords under direct vision,  positive ETCO2 and breath sounds checked- equal and bilateral Secured at: 19 cm Tube secured with: Tape Dental Injury: Teeth and Oropharynx as per pre-operative assessment

## 2017-01-29 LAB — SURGICAL PATHOLOGY

## 2017-02-03 ENCOUNTER — Telehealth: Payer: Self-pay | Admitting: *Deleted

## 2017-02-03 NOTE — Telephone Encounter (Signed)
Patient had surgery on a neck mass on 01/28/17 and went back to work on Friday and worked over the weekend, she wants to know if she can get a work note for light duty because her neck is having some pain and soreness.

## 2017-02-06 ENCOUNTER — Ambulatory Visit: Payer: BLUE CROSS/BLUE SHIELD | Admitting: General Surgery

## 2017-02-06 ENCOUNTER — Ambulatory Visit (INDEPENDENT_AMBULATORY_CARE_PROVIDER_SITE_OTHER): Payer: BLUE CROSS/BLUE SHIELD | Admitting: General Surgery

## 2017-02-06 ENCOUNTER — Encounter: Payer: Self-pay | Admitting: General Surgery

## 2017-02-06 VITALS — BP 116/68 | HR 98 | Resp 14 | Ht 65.0 in | Wt 154.0 lb

## 2017-02-06 DIAGNOSIS — D170 Benign lipomatous neoplasm of skin and subcutaneous tissue of head, face and neck: Secondary | ICD-10-CM

## 2017-02-06 NOTE — Progress Notes (Signed)
Patient ID: Christina Marshall, female   DOB: Dec 15, 1995, 21 y.o.   MRN: [id number]  No chief complaint on file.   HPI Christina Marshall is a 21 y.o. female here today for her post op excision of neck lipoma done on 01/28/2017. Patient states she is doing well. She has had some mild pains in her right shoulder-not causing any limitations HPI  Past Medical History:  Diagnosis Date  . Anxiety    H/O 2011  . Complication of anesthesia   . History of pneumonia as a child  . PONV (postoperative nausea and vomiting)     Past Surgical History:  Procedure Laterality Date  . ear cyst removal Left   . EXCISION MASS NECK N/A 01/28/2017   Procedure: EXCISION MASS NECK;  Surgeon: Christene Lye, MD;  Location: [facility] ORS;  Service: General;  Laterality: N/A;  . MOUTH SURGERY      Family History  Problem Relation Age of Onset  . Bipolar disorder Mother     Social History Social History  Substance Use Topics  . Smoking status: Never Smoker  . Smokeless tobacco: Never Used  . Alcohol use Yes     Comment: OCC    No Known Allergies  Current Outpatient Prescriptions  Medication Sig Dispense Refill  . Multiple Vitamin (MULTIVITAMIN) tablet Take 1 tablet by mouth daily.    . traMADol (ULTRAM) 50 MG tablet Take 1 tablet (50 mg total) by mouth every 6 (six) hours as needed. 10 tablet 0   No current facility-administered medications for this visit.     Review of Systems Review of Systems  Constitutional: Negative.   Respiratory: Negative.   Cardiovascular: Negative.     Blood pressure 116/68, pulse 98, resp. rate 14, height 5\' 5"  (1.651 m), weight 154 lb (69.9 kg).  Physical Exam Physical Exam  Constitutional: She is oriented to person, place, and time. She appears well-developed and well-nourished.  Pulmonary/Chest: Effort normal.  Neurological: She is alert and oriented to person, place, and time.  Skin: Skin is warm and dry.     Psychiatric: She has a normal mood  and affect. Her behavior is normal.    Data Reviewed Prior notes reviewed. Path- benign lipoma Assessment    History of posterior neck lipoma- status post surgical removal- patient is doing well, site of surgery healing well, patient reports slight tenderness in the area. Recommended mederma for any scarring if needed. Stable exam otherwise    Plan    Patient to return as needed. Advised to call with any questions or concerns.     HPI, Physical Exam, Assessment and Plan have been scribed under the direction and in the presence of Mckinley Jewel, MD  Gaspar Cola, CMA  I have completed the exam and reviewed the above documentation for accuracy and completeness.  I agree with the above.  Haematologist has been used and any errors in dictation or transcription are unintentional.  Cotey Rakes G. Jamal Collin, M.D., F.A.C.S.  Junie Panning G 02/06/2017, 5:03 PM

## 2017-02-06 NOTE — Patient Instructions (Signed)
Patient to return as needed. Advised to call with any questions or concerns.

## 2017-07-21 ENCOUNTER — Other Ambulatory Visit: Payer: Self-pay

## 2017-07-21 ENCOUNTER — Encounter (HOSPITAL_COMMUNITY): Payer: Self-pay | Admitting: Emergency Medicine

## 2017-07-21 ENCOUNTER — Emergency Department (HOSPITAL_COMMUNITY)
Admission: EM | Admit: 2017-07-21 | Discharge: 2017-07-22 | Disposition: A | Discharge status: Home / Self Care | Payer: BLUE CROSS/BLUE SHIELD | Attending: Emergency Medicine | Admitting: Emergency Medicine

## 2017-07-21 DIAGNOSIS — R1013 Epigastric pain: Secondary | ICD-10-CM | POA: Diagnosis present

## 2017-07-21 DIAGNOSIS — Z79899 Other long term (current) drug therapy: Secondary | ICD-10-CM | POA: Insufficient documentation

## 2017-07-21 DIAGNOSIS — N83209 Unspecified ovarian cyst, unspecified side: Secondary | ICD-10-CM | POA: Diagnosis not present

## 2017-07-21 LAB — URINALYSIS, ROUTINE W REFLEX MICROSCOPIC
Bilirubin Urine: NEGATIVE
Glucose, UA: NEGATIVE mg/dL
Ketones, ur: NEGATIVE mg/dL
Leukocytes, UA: NEGATIVE
NITRITE: NEGATIVE
PH: 6 (ref 5.0–8.0)
Protein, ur: NEGATIVE mg/dL
RBC / HPF: NONE SEEN RBC/hpf (ref 0–5)
SPECIFIC GRAVITY, URINE: 1.01 (ref 1.005–1.030)
WBC, UA: NONE SEEN WBC/hpf (ref 0–5)

## 2017-07-21 LAB — COMPREHENSIVE METABOLIC PANEL
ALBUMIN: 4.1 g/dL (ref 3.5–5.0)
ALT: 19 U/L (ref 14–54)
AST: 26 U/L (ref 15–41)
Alkaline Phosphatase: 60 U/L (ref 38–126)
Anion gap: 7 (ref 5–15)
BILIRUBIN TOTAL: 0.3 mg/dL (ref 0.3–1.2)
BUN: 11 mg/dL (ref 6–20)
CO2: 27 mmol/L (ref 22–32)
Calcium: 9 mg/dL (ref 8.9–10.3)
Chloride: 101 mmol/L (ref 101–111)
Creatinine, Ser: 0.74 mg/dL (ref 0.44–1.00)
GFR calc Af Amer: >60 mL/min (ref >60)
GFR calc non Af Amer: >60 mL/min (ref >60)
GLUCOSE: 92 mg/dL (ref 65–99)
POTASSIUM: 3.7 mmol/L (ref 3.5–5.1)
Sodium: 135 mmol/L (ref 135–145)
TOTAL PROTEIN: 7.4 g/dL (ref 6.5–8.1)

## 2017-07-21 LAB — CBC
HEMATOCRIT: 38.4 % (ref 36.0–46.0)
Hemoglobin: 12.5 g/dL (ref 12.0–15.0)
MCH: 25.8 pg — ABNORMAL LOW (ref 26.0–34.0)
MCHC: 32.6 g/dL (ref 30.0–36.0)
MCV: 79.3 fL (ref 78.0–100.0)
Platelets: 305 10*3/uL (ref 150–400)
RBC: 4.84 MIL/uL (ref 3.87–5.11)
RDW: 13.9 % (ref 11.5–15.5)
WBC: 9.1 10*3/uL (ref 4.0–10.5)

## 2017-07-21 LAB — I-STAT BETA HCG BLOOD, ED (MC, WL, AP ONLY): I-stat hCG, quantitative: <5 m[IU]/mL (ref <5)

## 2017-07-21 LAB — LIPASE, BLOOD: Lipase: 28 U/L (ref 11–51)

## 2017-07-21 MED ORDER — SODIUM CHLORIDE 0.9 % IV BOLUS (SEPSIS)
1000.0000 mL | Freq: Once | INTRAVENOUS | Status: AC
  Administered 2017-07-21: 1000 mL via INTRAVENOUS

## 2017-07-21 MED ORDER — ONDANSETRON 4 MG PO TBDP
4.0000 mg | ORAL_TABLET | Freq: Once | ORAL | Status: DC | PRN
  Filled 2017-07-21: qty 1

## 2017-07-21 MED ORDER — KETOROLAC TROMETHAMINE 30 MG/ML IJ SOLN
30.0000 mg | Freq: Once | INTRAMUSCULAR | Status: AC
  Administered 2017-07-21: 30 mg via INTRAVENOUS
  Filled 2017-07-21: qty 1

## 2017-07-21 NOTE — ED Provider Notes (Signed)
[facility] DEPT Provider Note   CSN: [id number] Arrival date & time: 07/21/17  1931     History   Chief Complaint Chief Complaint  Patient presents with  . Abdominal Pain    HPI Christina Marshall is a 21 y.o. female.  HPI  21 y.o. female, presents to the Emergency Department today due to possible appendicitis. Pt was seen at [facility] with concern on exam. Pt notes symptoms of abdominal pain since Friday. Notes on and off with worse pain this morning. Notes pain located in epigastric region as well as RLQ. States pain feels like a throbbing sensation. Notes worst of pain at urgent care that caused her to bring legs up to chest for relief. Now notes pain is 2/10. Mild discomfort with movement and palpation. Notes nausea without emesis. No diarrhea. Normal BMs. No CP/SOB. No vaginal bleeding/discharge. LMP x 1 week ago. Regular. No fevers. No meds PTA. No other symptoms noted     Past Medical History:  Diagnosis Date  . Anxiety    H/O 2011  . Complication of anesthesia   . History of pneumonia as a child  . PONV (postoperative nausea and vomiting)     There are no active problems to display for this patient.   Past Surgical History:  Procedure Laterality Date  . ear cyst removal Left   . EXCISION MASS NECK N/A 01/28/2017   Procedure: EXCISION MASS NECK;  Surgeon: Christene Lye, MD;  Location: [facility] ORS;  Service: General;  Laterality: N/A;  . MOUTH SURGERY      OB History    Gravida Para Term Preterm AB Living   0 0 0 0 0 0   SAB TAB Ectopic Multiple Live Births   0 0 0 0 0      Obstetric Comments   Menstrual age: 3          Home Medications    Prior to Admission medications   Medication Sig Start Date End Date Taking? Authorizing Provider  acidophilus (RISAQUAD) CAPS capsule Take 1 capsule by mouth daily.   Yes [provider]  Multiple Vitamin (MULTIVITAMIN) tablet Take 1 tablet by mouth daily.   Yes  [provider]  simethicone (MYLICON) 80 MG chewable tablet Chew 80 mg by mouth once.   Yes [provider]    Family History Family History  Problem Relation Age of Onset  . Bipolar disorder Mother     Social History Social History   Tobacco Use  . Smoking status: Never Smoker  . Smokeless tobacco: Never Used  Substance Use Topics  . Alcohol use: Yes    Comment: OCC  . Drug use: No     Allergies   Patient has no known allergies.   Review of Systems Review of Systems ROS reviewed and all are negative for acute change except as noted in the HPI.  Physical Exam Updated Vital Signs BP 128/85 (BP Location: Right Arm)   Pulse 98   Temp 98.2 F (36.8 C) (Oral)   Resp 17   Ht 5\' 5"  (1.651 m)   Wt 71.9 kg (158 lb 9.6 oz)   LMP 07/08/2017   SpO2 99%   BMI 26.39 kg/m   Physical Exam  Constitutional: She is oriented to person, place, and time. Vital signs are normal. She appears well-developed and well-nourished. No distress.  NAD  HENT:  Head: Normocephalic and atraumatic.  Right Ear: Hearing, tympanic membrane, external ear and ear canal normal.  Left Ear: Hearing, tympanic membrane, external ear and ear canal normal.  Nose: Nose normal.  Mouth/Throat: Uvula is midline, oropharynx is clear and moist and mucous membranes are normal. No trismus in the jaw. No oropharyngeal exudate, posterior oropharyngeal erythema or tonsillar abscesses.  Eyes: Conjunctivae and EOM are normal. Pupils are equal, round, and reactive to light.  Neck: Normal range of motion. Neck supple. No tracheal deviation present.  Cardiovascular: Normal rate, regular rhythm, S1 normal, S2 normal, normal heart sounds, intact distal pulses and normal pulses.  Pulmonary/Chest: Effort normal and breath sounds normal. No respiratory distress. She has no decreased breath sounds. She has no wheezes. She has no rhonchi. She has no rales.  Abdominal: Soft. Normal appearance and bowel sounds  are normal. There is tenderness in the right lower quadrant, epigastric area and left upper quadrant. There is tenderness at McBurney's point. There is no rigidity, no rebound, no guarding, no CVA tenderness and negative Murphy's sign.  Abdomen soft  Musculoskeletal: Normal range of motion.  Neurological: She is alert and oriented to person, place, and time.  Skin: Skin is warm and dry.  Psychiatric: She has a normal mood and affect. Her speech is normal and behavior is normal. Thought content normal.  Nursing note and vitals reviewed.    ED Treatments / Results  Labs (all labs ordered are listed, but only abnormal results are displayed) Labs Reviewed  CBC - Abnormal; Notable for the following components:      Result Value   MCH 25.8 (*)    All other components within normal limits  URINALYSIS, ROUTINE W REFLEX MICROSCOPIC - Abnormal; Notable for the following components:   Color, Urine STRAW (*)    Hgb urine dipstick SMALL (*)    Bacteria, UA RARE (*)    Squamous Epithelial / LPF 0-5 (*)    All other components within normal limits  LIPASE, BLOOD  COMPREHENSIVE METABOLIC PANEL  I-STAT BETA HCG BLOOD, ED ([facility], [facility], [facility] ONLY)    EKG  EKG Interpretation None       Radiology Ct Abdomen Pelvis W Contrast  Result Date: 07/22/2017 CLINICAL DATA:  Abdominal pain, appendicitis suspected. Epigastric pain radiating to the right lower quadrant. EXAM: CT ABDOMEN AND PELVIS WITH CONTRAST TECHNIQUE: Multidetector CT imaging of the abdomen and pelvis was performed using the standard protocol following bolus administration of intravenous contrast. CONTRAST:  100 cc Isovue-300 IV COMPARISON:  None. FINDINGS: Lower chest: The lung bases are clear. Hepatobiliary: No focal liver abnormality is seen. No gallstones, gallbladder wall thickening, or biliary dilatation. Pancreas: No ductal dilatation or inflammation. Spleen: Normal in size without focal abnormality. Adrenals/Urinary Tract: Adrenal glands  are unremarkable. Kidneys are normal, without renal calculi, focal lesion, or hydronephrosis. Bladder is unremarkable. Stomach/Bowel: Stomach is nondistended. No small bowel wall thickening, inflammation for obstruction. Appendix not confidently visualized, normal or abnormal. Small to moderate colonic stool burden without colonic wall thickening or inflammation. Vascular/Lymphatic: No significant vascular findings are present. No enlarged abdominal or pelvic lymph nodes. Reproductive: Possible corpus luteal cyst in the right ovary. Uterus and left adnexa unremarkable. Other: Minimal free fluid in the pelvis and right lower quadrant. No intra-abdominal abscess. No free air. Musculoskeletal: There are no acute or suspicious osseous abnormalities. IMPRESSION: 1. Appendix not visualized, normal or abnormal. 2. Possible corpus luteal cyst in the right ovary, may be cause of right-sided pain. Trace free fluid in the pelvis and right lower quadrant but no inflammatory changes. Electronically Signed   By: Threasa Beards  Ehinger M.D.   On: 07/22/2017 01:27    Procedures Procedures (including critical care time)  Medications Ordered in ED Medications  ondansetron (ZOFRAN-ODT) disintegrating tablet 4 mg (not administered)  sodium chloride 0.9 % bolus 1,000 mL (not administered)  ketorolac (TORADOL) 30 MG/ML injection 30 mg (not administered)     Initial Impression / Assessment and Plan / ED Course  I have reviewed the triage vital signs and the nursing notes.  Pertinent labs & imaging results that were available during my care of the patient were reviewed by me and considered in my medical decision making (see chart for details).  Final Clinical Impressions(s) / ED Diagnoses  {I have reviewed and evaluated the relevant laboratory values. {I have reviewed and evaluated the relevant imaging studies.  {I have reviewed the relevant previous healthcare records.  {I obtained HPI from historian.   ED  Course:  Assessment: Pt is a 21 y.o. female presents to the Emergency Department today due to possible appendicitis. Pt was seen at [facility] with concern on exam. Pt notes symptoms of abdominal pain since Friday. Notes on and off with worse pain this morning. Notes pain located in epigastric region as well as RLQ. States pain feels like a throbbing sensation. Notes worst of pain at urgent care that caused her to bring legs up to chest for relief. Now notes pain is 2/10. Mild discomfort with movement and palpation. Notes nausea without emesis. No diarrhea. Normal BMs. No CP/SOB. No vaginal bleeding/discharge. LMP x 1 week ago. Regular. No fevers. No meds PTA. On exam, pt in NAD. Nontoxic/nonseptic appearing. VSS. Afebrile. Lungs CTA. Heart RRR. Abdomen TTP RLQ. Labs unremarkable and reassuring. Due to point tenderness on exam, CT abdomen pelvis shows no evidence of appendicitis. Showed ovarian cyst. Given fluids and Toradol in ED with immediate relief of pain. Doubt Torsion. Doubt PID. Plan is to DC home with follow up to GYN. At time of discharge, Patient is in no acute distress. Vital Signs are stable. Patient is able to ambulate. Patient able to tolerate PO.   Disposition/Plan:  DC Home Additional Verbal discharge instructions given and discussed with patient.  Pt Instructed to f/u with GYN in the next week for evaluation and treatment of symptoms. Return precautions given Pt acknowledges and agrees with plan  Supervising Physician Jola Schmidt, MD  Final diagnoses:  Ruptured ovarian cyst    ED Discharge Orders    None       Shary Decamp, PA-C 07/22/17 [place], MD 07/22/17 [phone]

## 2017-07-21 NOTE — ED Notes (Signed)
Bed: WA06 Expected date:  Expected time:  Means of arrival:  Comments: 

## 2017-07-21 NOTE — ED Triage Notes (Signed)
Pt reports being seen at urgent care earlier and was advised to come for evaluation of possible appendicitis. Pt reports that pain began around 1330 with worse pain at epigastric area and into RLQ. Pt reports pain feels like a pressure.

## 2017-07-22 ENCOUNTER — Encounter (HOSPITAL_COMMUNITY): Payer: Self-pay

## 2017-07-22 ENCOUNTER — Emergency Department (HOSPITAL_COMMUNITY): Payer: BLUE CROSS/BLUE SHIELD

## 2017-07-22 MED ORDER — IOPAMIDOL (ISOVUE-300) INJECTION 61%
100.0000 mL | Freq: Once | INTRAVENOUS | Status: AC | PRN
  Administered 2017-07-22: 100 mL via INTRAVENOUS

## 2017-07-22 MED ORDER — IBUPROFEN 600 MG PO TABS: 600.0000 mg | ORAL_TABLET | Freq: Four times a day (QID) | ORAL | 0 refills | Status: DC | PRN

## 2017-07-22 MED ORDER — IOPAMIDOL (ISOVUE-300) INJECTION 61%
INTRAVENOUS | Status: AC
  Administered 2017-07-22: 100 mL via INTRAVENOUS
  Filled 2017-07-22: qty 100

## 2017-07-22 NOTE — Discharge Instructions (Signed)
Please read and follow all provided instructions.  Your diagnoses today include:  1. Ruptured ovarian cyst     Tests performed today include: Vital signs. See below for your results today.   Medications prescribed:  Take as prescribed   Home care instructions:  Follow any educational materials contained in this packet.  Follow-up instructions: Please follow-up with your gynecology for further evaluation of symptoms and treatment   Return instructions:  Please return to the Emergency Department if you do not get better, if you get worse, or new symptoms OR  - Fever (temperature greater than 101.81F)  - Bleeding that does not stop with holding pressure to the area    -Severe pain (please note that you may be more sore the day after your accident)  - Chest Pain  - Difficulty breathing  - Severe nausea or vomiting  - Inability to tolerate food and liquids  - Passing out  - Skin becoming red around your wounds  - Change in mental status (confusion or lethargy)  - New numbness or weakness    Please return if you have any other emergent concerns.  Additional Information:  Your vital signs today were: BP 128/85 (BP Location: Right Arm)    Pulse 98    Temp 98.2 F (36.8 C) (Oral)    Resp 17    Ht 5\' 5"  (1.651 m)    Wt 71.9 kg (158 lb 9.6 oz)    LMP 07/08/2017 Comment: negative beta HCG 07/21/17   SpO2 99%    BMI 26.39 kg/m  If your blood pressure (BP) was elevated above 135/85 this visit, please have this repeated by your doctor within one month. ---------------

## 2017-08-11 ENCOUNTER — Encounter: Payer: Self-pay | Admitting: Obstetrics & Gynecology

## 2017-08-11 ENCOUNTER — Ambulatory Visit (INDEPENDENT_AMBULATORY_CARE_PROVIDER_SITE_OTHER): Payer: BLUE CROSS/BLUE SHIELD | Admitting: Obstetrics & Gynecology

## 2017-08-11 VITALS — Wt 156.7 lb

## 2017-08-11 DIAGNOSIS — N83201 Unspecified ovarian cyst, right side: Secondary | ICD-10-CM | POA: Diagnosis not present

## 2017-08-11 NOTE — Progress Notes (Signed)
History:  21 y.o. G0P0000 LMP 08/03/2017 prev LMP 07/08/2017  here today for eval of pelvic pain. Pt reports pain on Nov 23rd right before ovulation. She was seen in the ED at [facility]. She was rx'd NSAIDS which she did not take because she did not feel that they were helping. She reports that the pain was severe and she feels like she knew exactly when the cyst ruptured.  Pt noted persistent pain until her cycle came this month and now the pain is almost completely resolved.   Pt reports that she was sexually active once with some pain. 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 She works at [employer] but, live in [place].    The following portions of the patient's history were reviewed and updated as appropriate: allergies, current medications, past family history, past medical history, past social history, past surgical history and problem list.  Review of Systems:  Pertinent items are noted in HPI.   Objective:  Physical Exam Wt 156 lb 11.2 oz (71.1 kg)   LMP 08/03/2017 (Exact Date)   BMI 26.08 kg/m  CONSTITUTIONAL: Well-developed, well-nourished female in no acute distress.  HENT:  Normocephalic, atraumatic EYES: Conjunctivae and EOM are normal. No scleral icterus.  NECK: Normal range of motion SKIN: Skin is warm and dry. No rash noted. Not diaphoretic.No pallor. [place]: Alert and oriented to person, place, and time. Normal coordination.  Abd: soft; NT, ND Pelvic: deferred  Labs and Imaging Ct Abdomen Pelvis W Contrast  Result Date: 07/22/2017 CLINICAL DATA:  Abdominal pain, appendicitis suspected. Epigastric pain radiating to the right lower quadrant. EXAM: CT ABDOMEN AND PELVIS WITH CONTRAST TECHNIQUE: Multidetector CT imaging of the abdomen and pelvis was performed using the standard protocol following bolus administration of intravenous contrast. CONTRAST:  100 cc Isovue-300 IV COMPARISON:  None. FINDINGS: Lower chest: The lung bases are  clear. Hepatobiliary: No focal liver abnormality is seen. No gallstones, gallbladder wall thickening, or biliary dilatation. Pancreas: No ductal dilatation or inflammation. Spleen: Normal in size without focal abnormality. Adrenals/Urinary Tract: Adrenal glands are unremarkable. Kidneys are normal, without renal calculi, focal lesion, or hydronephrosis. Bladder is unremarkable. Stomach/Bowel: Stomach is nondistended. No small bowel wall thickening, inflammation for obstruction. Appendix not confidently visualized, normal or abnormal. Small to moderate colonic stool burden without colonic wall thickening or inflammation. Vascular/Lymphatic: No significant vascular findings are present. No enlarged abdominal or pelvic lymph nodes. Reproductive: Possible corpus luteal cyst in the right ovary. Uterus and left adnexa unremarkable. Other: Minimal free fluid in the pelvis and right lower quadrant. No intra-abdominal abscess. No free air. Musculoskeletal: There are no acute or suspicious osseous abnormalities. IMPRESSION: 1. Appendix not visualized, normal or abnormal. 2. Possible corpus luteal cyst in the right ovary, may be cause of right-sided pain. Trace free fluid in the pelvis and right lower quadrant but no inflammatory changes. Electronically Signed   By: Jeb Levering M.D.   On: 07/22/2017 01:27    Assessment & Plan:  Ruptured OV cyst- improved sx  Reviewed with pt starting OCPs. I have explained to her that it would have no effect on an current sx. She is not sexually active and declines OCPs. I agree that it would not be helpful to her at present. She was encouraged to f/u if her sx return   I reviewed the usual sx that occur with onset of sexual activity. Pt has no plans to be sexually active in the  near future.   Total face-to-face time with patient was 20 min.  Greater than 50% was spent in counseling and coordination of care with the patient.   Noa Constante L. Harraway-Smith, M.D., Cherlynn June

## 2017-08-11 NOTE — Patient Instructions (Signed)
Ovarian Cyst An ovarian cyst is a fluid-filled sac that forms on an ovary. The ovaries are small organs that produce eggs in women. Various types of cysts can form on the ovaries. Some may cause symptoms and require treatment. Most ovarian cysts go away on their own, are not cancerous (are benign), and do not cause problems. Common types of ovarian cysts include:  Functional (follicle) cysts. ? Occur during the menstrual cycle, and usually go away with the next menstrual cycle if you do not get pregnant. ? Usually cause no symptoms.  Endometriomas. ? Are cysts that form from the tissue that lines the uterus (endometrium). ? Are sometimes called "chocolate cysts" because they become filled with blood that turns brown. ? Can cause pain in the lower abdomen during intercourse and during your period.  Cystadenoma cysts. ? Develop from cells on the outside surface of the ovary. ? Can get very large and cause lower abdomen pain and pain with intercourse. ? Can cause severe pain if they twist or break open (rupture).  Dermoid cysts. ? Are sometimes found in both ovaries. ? May contain different kinds of body tissue, such as skin, teeth, hair, or cartilage. ? Usually do not cause symptoms unless they get very big.  Theca lutein cysts. ? Occur when too much of a certain hormone (human chorionic gonadotropin) is produced and overstimulates the ovaries to produce an egg. ? Are most common after having procedures used to assist with the conception of a baby (in vitro fertilization).  What are the causes? Ovarian cysts may be caused by:  Ovarian hyperstimulation syndrome. This is a condition that can develop from taking fertility medicines. It causes multiple large ovarian cysts to form.  Polycystic ovarian syndrome (PCOS). This is a common hormonal disorder that can cause ovarian cysts, as well as problems with your period or fertility.  What increases the risk? The following factors may make  you more likely to develop ovarian cysts:  Being overweight or obese.  Taking fertility medicines.  Taking certain forms of hormonal birth control.  Smoking.  What are the signs or symptoms? Many ovarian cysts do not cause symptoms. If symptoms are present, they may include:  Pelvic pain or pressure.  Pain in the lower abdomen.  Pain during sex.  Abdominal swelling.  Abnormal menstrual periods.  Increasing pain with menstrual periods.  How is this diagnosed? These cysts are commonly found during a routine pelvic exam. You may have tests to find out more about the cyst, such as:  Ultrasound.  X-ray of the pelvis.  CT scan.  MRI.  Blood tests.  How is this treated? Many ovarian cysts go away on their own without treatment. Your health care provider may want to check your cyst regularly for 2-3 months to see if it changes. If you are in menopause, it is especially important to have your cyst monitored closely because menopausal women have a higher rate of ovarian cancer. When treatment is needed, it may include:  Medicines to help relieve pain.  A procedure to drain the cyst (aspiration).  Surgery to remove the whole cyst.  Hormone treatment or birth control pills. These methods are sometimes used to help dissolve a cyst.  Follow these instructions at home:  Take over-the-counter and prescription medicines only as told by your health care provider.  Do not drive or use heavy machinery while taking prescription pain medicine.  Get regular pelvic exams and Pap tests as often as told by your health care   provider.  Return to your normal activities as told by your health care provider. Ask your health care provider what activities are safe for you.  Do not use any products that contain nicotine or tobacco, such as cigarettes and e-cigarettes. If you need help quitting, ask your health care provider.  Keep all follow-up visits as told by your health care provider.  This is important. Contact a health care provider if:  Your periods are late, irregular, or painful, or they stop.  You have pelvic pain that does not go away.  You have pressure on your bladder or trouble emptying your bladder completely.  You have pain during sex.  You have any of the following in your abdomen: ? A feeling of fullness. ? Pressure. ? Discomfort. ? Pain that does not go away. ? Swelling.  You feel generally ill.  You become constipated.  You lose your appetite.  You develop severe acne.  You start to have more body hair and facial hair.  You are gaining weight or losing weight without changing your exercise and eating habits.  You think you may be pregnant. Get help right away if:  You have abdominal pain that is severe or gets worse.  You cannot eat or drink without vomiting.  You suddenly develop a fever.  Your menstrual period is much heavier than usual. This information is not intended to replace advice given to you by your health care provider. Make sure you discuss any questions you have with your health care provider. Document Released: 08/12/2005 Document Revised: 03/01/2016 Document Reviewed: 01/14/2016 Elsevier Interactive Patient Education  2018 [employer]  

## 2018-04-09 IMAGING — CT CT ABD-PELV W/ CM
2 of 4 series · 16 of 46 positions shown, 18 images · IV contrast (ISOVUE)
Comparison: None.

CLINICAL DATA: Abdominal pain, appendicitis suspected. Epigastric
pain radiating to the right lower quadrant.

EXAM:
CT ABDOMEN AND PELVIS WITH CONTRAST
TECHNIQUE: Multidetector CT imaging of the abdomen and pelvis was performed
using the standard protocol following bolus administration of
intravenous contrast.
CONTRAST:  100 cc Isovue-H33 IV

[Series 2: abd/pel with · axial · 0.64mm/px · z∈[+1145,+1500]mm · 13 of 79 slices shown, 15 images]
[im 4/79  soft-tissue]
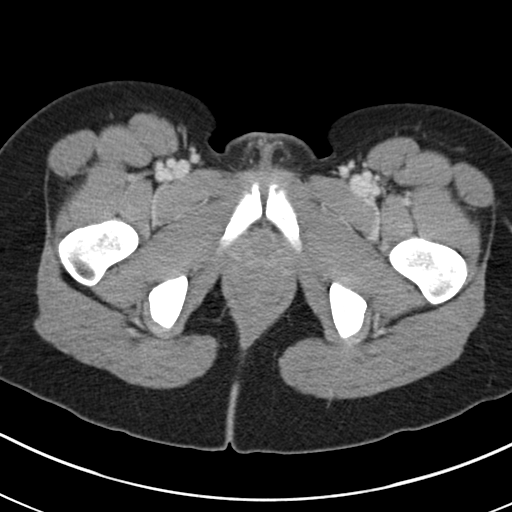
[im 4/79  bone]
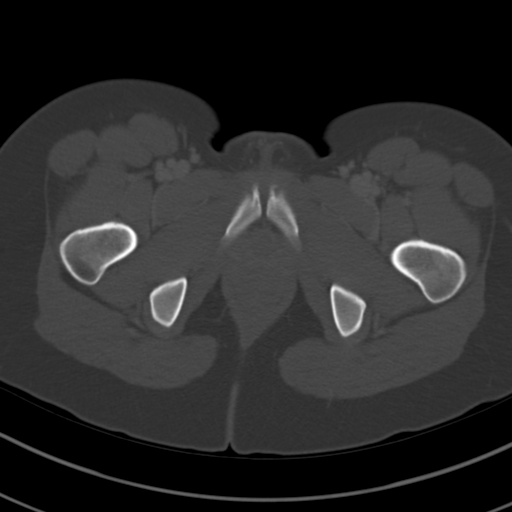
[im 12/79  soft-tissue]
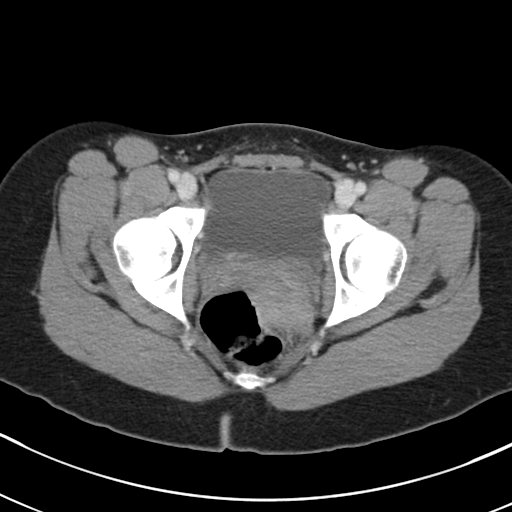
[im 16/79  soft-tissue]
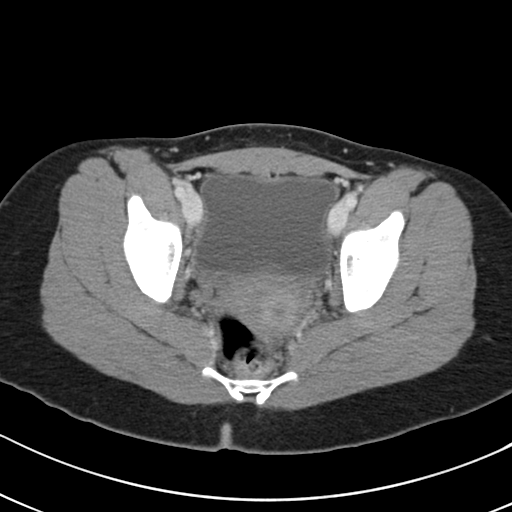
[im 24/79  soft-tissue]
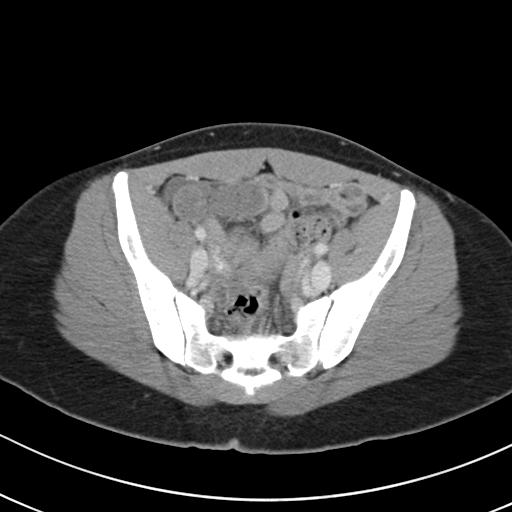
[im 28/79  soft-tissue]
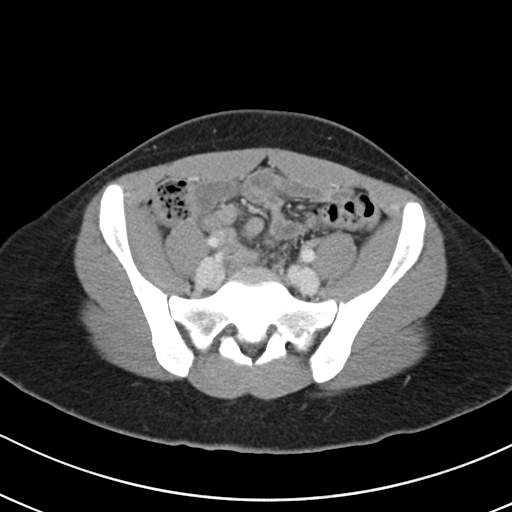
[im 36/79  soft-tissue]
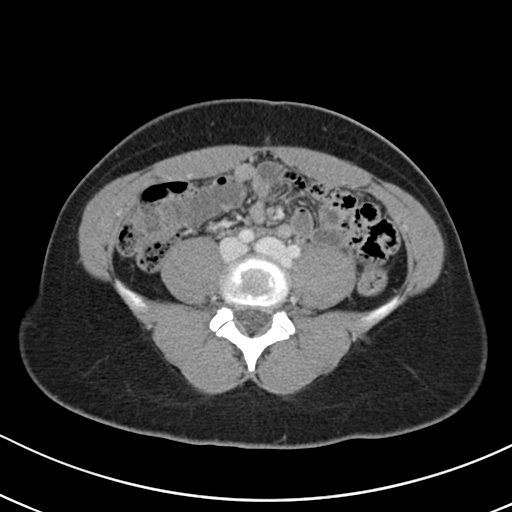
[im 40/79  soft-tissue]
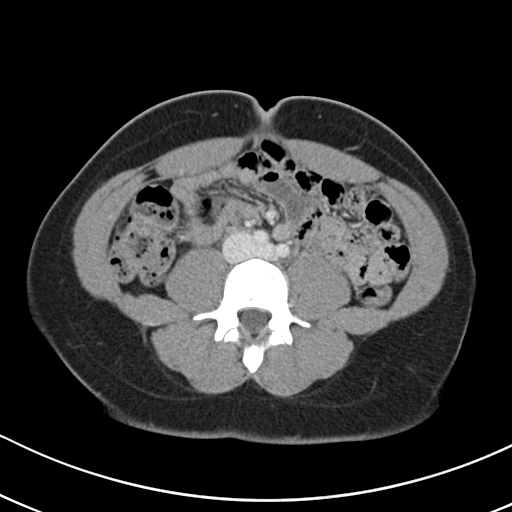
[im 43/79  soft-tissue]
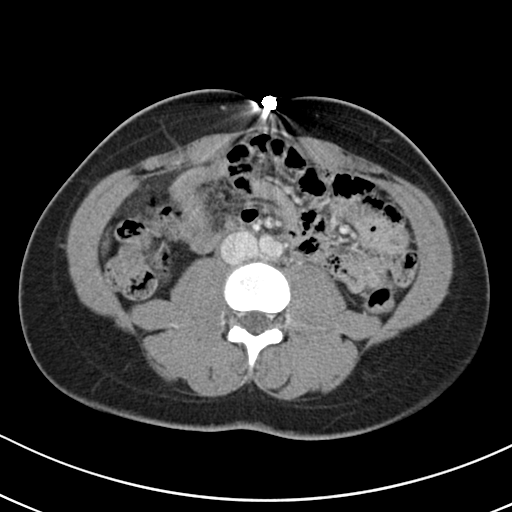
[im 51/79  soft-tissue]
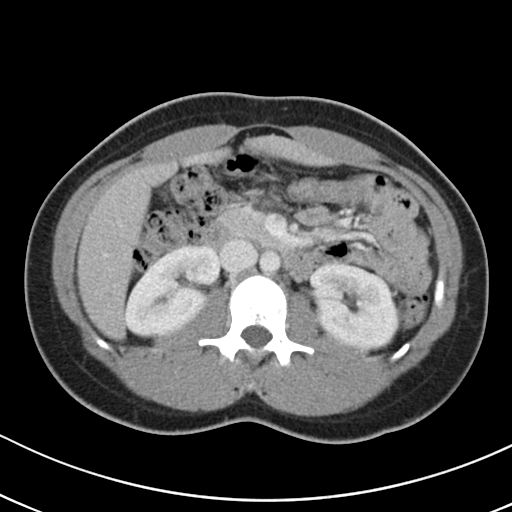
[im 51/79  bone]
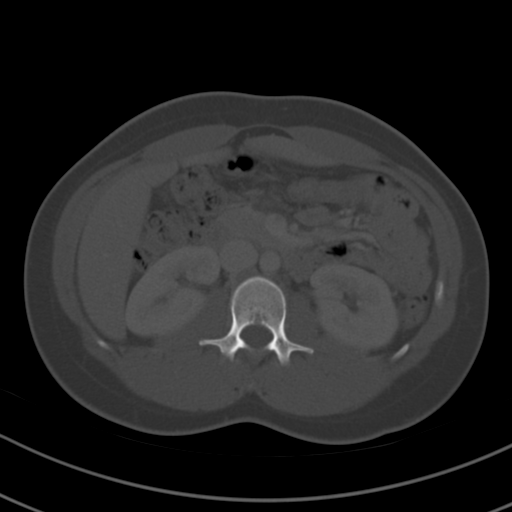
[im 55/79  soft-tissue]
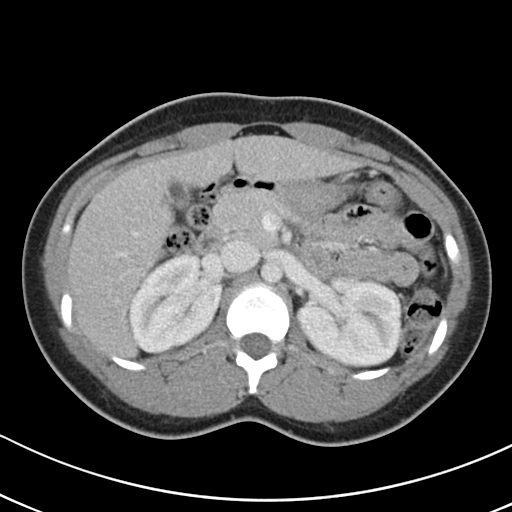
[im 63/79  soft-tissue]
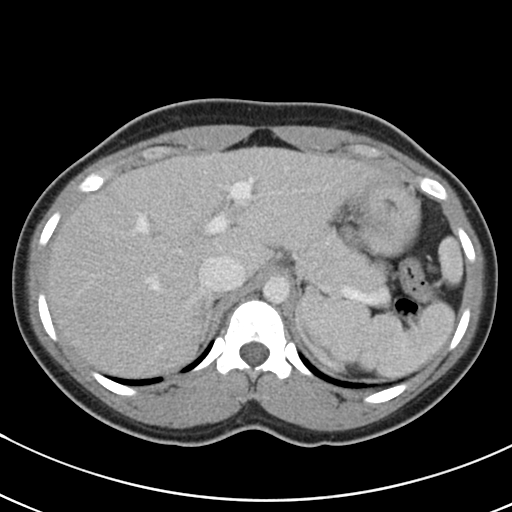
[im 67/79  soft-tissue]
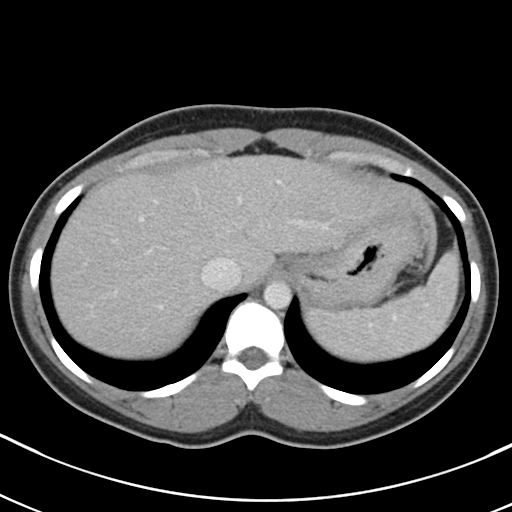
[im 75/79  soft-tissue]
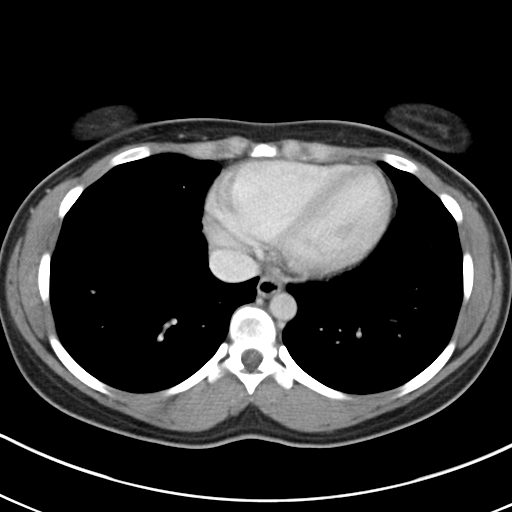

[Series 4: coronal a/|p · coronal · 0.63mm/px · 3 of 105 slices shown]
[im 35/105  soft-tissue]
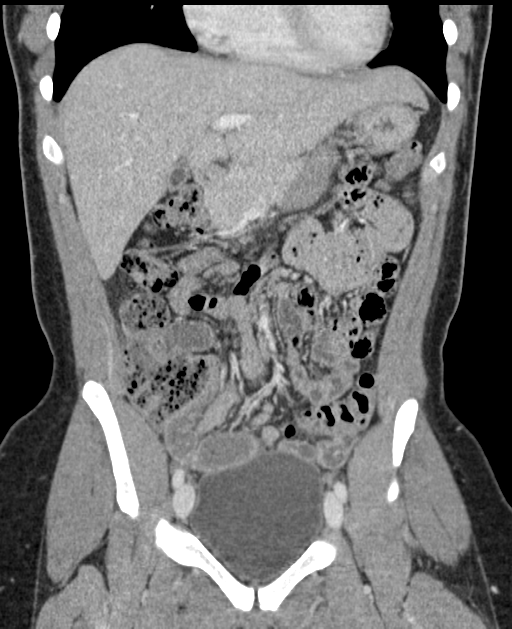
[im 47/105  soft-tissue]
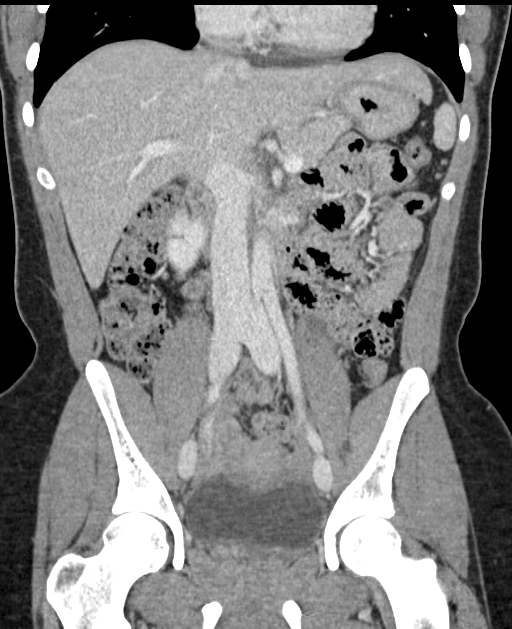
[im 58/105  soft-tissue]
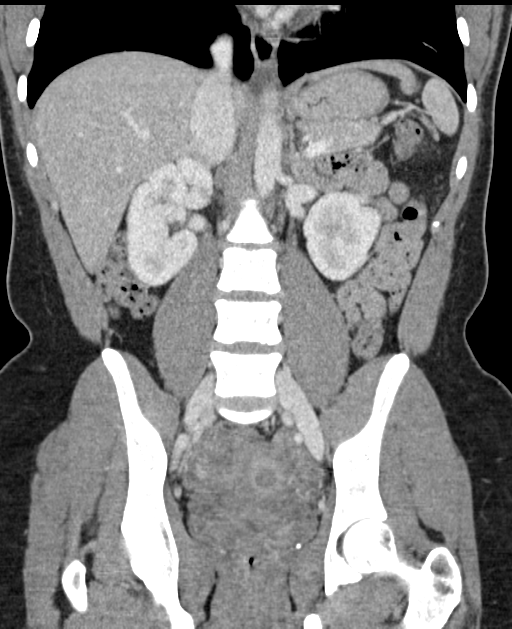

[16 of 46 positions shown; findings below may reference images not displayed]

FINDINGS: Lower chest: The lung bases are clear.

Hepatobiliary: No focal liver abnormality is seen. No gallstones,
gallbladder wall thickening, or biliary dilatation.

Pancreas: No ductal dilatation or inflammation.

Spleen: Normal in size without focal abnormality.

Adrenals/Urinary Tract: Adrenal glands are unremarkable. Kidneys are
normal, without renal calculi, focal lesion, or hydronephrosis.
Bladder is unremarkable.

Stomach/Bowel: Stomach is nondistended. No small bowel wall
thickening, inflammation for obstruction. Appendix not confidently
visualized, normal or abnormal. Small to moderate colonic stool
burden without colonic wall thickening or inflammation.

Vascular/Lymphatic: No significant vascular findings are present. No
enlarged abdominal or pelvic lymph nodes.

Reproductive: Possible corpus luteal cyst in the right ovary. Uterus
and left adnexa unremarkable.

Other: Minimal free fluid in the pelvis and right lower quadrant. No
intra-abdominal abscess. No free air.

Musculoskeletal: There are no acute or suspicious osseous
abnormalities.
IMPRESSION: 1. Appendix not visualized, normal or abnormal.
2. Possible corpus luteal cyst in the right ovary, may be cause of
right-sided pain. Trace free fluid in the pelvis and right lower
quadrant but no inflammatory changes.

## 2018-10-07 ENCOUNTER — Encounter (HOSPITAL_COMMUNITY): Payer: Self-pay | Admitting: Emergency Medicine

## 2018-10-07 ENCOUNTER — Ambulatory Visit (HOSPITAL_COMMUNITY)
Admission: EM | Admit: 2018-10-07 | Discharge: 2018-10-07 | Disposition: A | Discharge status: Home / Self Care | Payer: No Typology Code available for payment source | Attending: Internal Medicine | Admitting: Internal Medicine

## 2018-10-07 DIAGNOSIS — S93411A Sprain of calcaneofibular ligament of right ankle, initial encounter: Secondary | ICD-10-CM | POA: Diagnosis not present

## 2018-10-07 MED ORDER — IBUPROFEN 600 MG PO TABS: 600.0000 mg | ORAL_TABLET | Freq: Four times a day (QID) | ORAL | 0 refills | Status: DC | PRN

## 2018-10-07 NOTE — ED Triage Notes (Signed)
Pt states on Monday night she started having sharp pain in her R ankle. Denies injury, states she walks around at work a lot and it started hurting.

## 2018-10-07 NOTE — ED Provider Notes (Signed)
[place]    CSN: [id number] Arrival date & time: 10/07/18  1030     History   Chief Complaint Chief Complaint  Patient presents with  . Ankle Pain    HPI Christina Marshall is a 23 y.o. female with no significant past medical history comes to the urgent care on account of right ankle pain which started a couple of days ago.  Patient denies knowledge of any inciting agent.  She denies trauma to the right lower extremity.  Pain is of moderate severity.  It is sharp in nature.  No radiation to the toes.  It is not associated with swelling or decreased range of motion.  Patient is able to bear weight on the right ankle.  No bruising  HPI  Past Medical History:  Diagnosis Date  . Anxiety    H/O 2011  . Complication of anesthesia   . History of pneumonia as a child  . PONV (postoperative nausea and vomiting)     There are no active problems to display for this patient.   Past Surgical History:  Procedure Laterality Date  . ear cyst removal Left   . EXCISION MASS NECK N/A 01/28/2017   Procedure: EXCISION MASS NECK;  Surgeon: Christene Lye, MD;  Location: [facility] ORS;  Service: General;  Laterality: N/A;  . MOUTH SURGERY      OB History    Gravida  0   Para  0   Term  0   Preterm  0   AB  0   Living  0     SAB  0   TAB  0   Ectopic  0   Multiple  0   Live Births  0        Obstetric Comments  Menstrual age: 38            Home Medications    Prior to Admission medications   Medication Sig Start Date End Date Taking? Authorizing Provider  ibuprofen (ADVIL,MOTRIN) 600 MG tablet Take 1 tablet (600 mg total) by mouth every 6 (six) hours as needed. 10/07/18   LampteyMyrene Galas, MD    Family History Family History  Problem Relation Age of Onset  . Bipolar disorder Mother     Social History Social History   Tobacco Use  . Smoking status: Never Smoker  . Smokeless tobacco: Never Used  Substance Use Topics  . Alcohol  use: Yes    Comment: OCC  . Drug use: No     Allergies   Patient has no known allergies.   Review of Systems Review of Systems  Constitutional: Negative for activity change, appetite change, chills, fatigue and fever.  Respiratory: Negative for cough and shortness of breath.   Cardiovascular: Negative for chest pain and palpitations.  Gastrointestinal: Negative for abdominal distention and abdominal pain.  Musculoskeletal: Positive for arthralgias. Negative for back pain, gait problem, joint swelling and myalgias.  Neurological: Negative for dizziness, syncope and light-headedness.  Hematological: Negative for adenopathy. Does not bruise/bleed easily.     Physical Exam Triage Vital Signs ED Triage Vitals  Enc Vitals Group     BP 10/07/18 1142 (!) 131/91     Pulse Rate 10/07/18 1142 93     Resp 10/07/18 1142 18     Temp 10/07/18 1142 98.7 F (37.1 C)     Temp src --      SpO2 10/07/18 1142 100 %     Weight --  Height --      Head Circumference --      Peak Flow --      Pain Score 10/07/18 1143 3     Pain Loc --      Pain Edu? --      Excl. in [place]? --    No data found.  Updated Vital Signs BP (!) 131/91   Pulse 93   Temp 98.7 F (37.1 C)   Resp 18   LMP 08/25/2018   SpO2 100%   Visual Acuity Right Eye Distance:   Left Eye Distance:   Bilateral Distance:    Right Eye Near:   Left Eye Near:    Bilateral Near:     Physical Exam Vitals signs and nursing note reviewed.  Constitutional:      Appearance: Normal appearance.  Cardiovascular:     Rate and Rhythm: Normal rate and regular rhythm.     Pulses: Normal pulses.     Heart sounds: Normal heart sounds.  Pulmonary:     Effort: Pulmonary effort is normal.     Breath sounds: Normal breath sounds.  Abdominal:     General: Abdomen is flat. Bowel sounds are normal.     Palpations: Abdomen is soft.  Musculoskeletal:        General: Tenderness present.     Comments: Right ankle tenderness.  Full  range of motion with minimal tenderness.  No tenderness or swelling over the medial or lateral malleoli  Neurological:     Mental Status: She is alert.      UC Treatments / Results  Labs (all labs ordered are listed, but only abnormal results are displayed) Labs Reviewed - No data to display  EKG None  Radiology No results found.  Procedures Procedures (including critical care time)  Medications Ordered in UC Medications - No data to display  Initial Impression / Assessment and Plan / UC Course  I have reviewed the triage vital signs and the nursing notes.  Pertinent labs & imaging results that were available during my care of the patient were reviewed by me and considered in my medical decision making (see chart for details).    1.  Right ankle sprain: Ace wrap Ice right ankle Nonsteroidal anti-inflammatory drugs Gentle exercises  Final Clinical Impressions(s) / UC Diagnoses   Final diagnoses:  Sprain of calcaneofibular ligament of right ankle, initial encounter   Discharge Instructions   None    ED Prescriptions    Medication Sig Dispense Auth. Provider   ibuprofen (ADVIL,MOTRIN) 600 MG tablet Take 1 tablet (600 mg total) by mouth every 6 (six) hours as needed. 30 tablet Zaion Hreha, Myrene Galas, MD     Controlled Substance Prescriptions [place] Controlled Substance Registry consulted? No   Chase Picket, MD 10/07/18 1227

## 2018-11-13 ENCOUNTER — Ambulatory Visit: Payer: Self-pay | Admitting: Family Medicine

## 2019-03-16 ENCOUNTER — Other Ambulatory Visit: Payer: Self-pay

## 2019-03-16 ENCOUNTER — Ambulatory Visit (INDEPENDENT_AMBULATORY_CARE_PROVIDER_SITE_OTHER): Payer: Self-pay | Admitting: Physician Assistant

## 2019-03-16 VITALS — BP 110/65 | HR 99 | Temp 98.6°F | Resp 16 | Wt 154.0 lb

## 2019-03-16 DIAGNOSIS — M545 Low back pain, unspecified: Secondary | ICD-10-CM

## 2019-03-16 DIAGNOSIS — Z3490 Encounter for supervision of normal pregnancy, unspecified, unspecified trimester: Secondary | ICD-10-CM

## 2019-03-16 DIAGNOSIS — R110 Nausea: Secondary | ICD-10-CM

## 2019-03-16 LAB — POCT URINALYSIS DIPSTICK
Bilirubin, UA: NEGATIVE
Blood, UA: NEGATIVE
Glucose, UA: NEGATIVE
Leukocytes, UA: NEGATIVE
Nitrite, UA: NEGATIVE
Protein, UA: NEGATIVE
Spec Grav, UA: 1.01 (ref 1.010–1.025)
Urobilinogen, UA: 0.2 E.U./dL
pH, UA: 6 (ref 5.0–8.0)

## 2019-03-16 LAB — POCT URINE PREGNANCY: Preg Test, Ur: POSITIVE — AB

## 2019-03-16 MED ORDER — ONDANSETRON 4 MG PO TBDP: 4.0000 mg | ORAL_TABLET | Freq: Three times a day (TID) | ORAL | 0 refills | Status: AC | PRN

## 2019-03-16 NOTE — Patient Instructions (Addendum)
Thank you for choosing [facility] for your health care needs.  You have been diagnosed with nausea. Most likely secondary to pregnancy (your urine pregnancy test was positive).  Take medication as prescribed. Zofran 4mg .  Stay hydrated; sip on water, Gingerale, and/or Gatorade. Eat a bland diet; mashed potatoes, applesauce, bananas, oatmeal, scrambled eggs, saltine crackers, broth soup, etc.  May use over the counter Metamucil or MiraLax for constipation/bloating.  Go directly to the ED if you develop fever, chills, sweats, headache, lightheadedness/dizziness, abdominal pain, or other new/concerning symptom.  Recommend you call your family physician for earlier appointment; should be seen within the next few days. If you cannot be seen, may wish to have evaluation at urgent care or ED.  Follow-up with your Ob/Gyn for further management. Recommend taking an over the counter pre-natal vitamin. Recommend no alcohol.  Nausea, Adult Nausea is feeling sick to your stomach or feeling that you are about to throw up (vomit). Feeling sick to your stomach is usually not serious, but it may be an early sign of a more serious medical problem. As you feel sicker to your stomach, you may throw up. If you throw up, or if you are not able to drink enough fluids, there is a risk that you may lose too much water in your body (get dehydrated). If you lose too much water in your body, you may:  Feel tired.  Feel thirsty.  Have a dry mouth.  Have cracked lips.  Go pee (urinate) less often. Older adults and people who have other diseases or a weak body defense system (immune system) have a higher risk of losing too much water in the body. The main goals of treating this condition are:  To relieve your nausea.  To ensure your nausea occurs less often.  To prevent throwing up and losing too much fluid. Follow these instructions at home: Watch your symptoms for any changes. Tell your doctor about  them. Follow these instructions as told by your doctor. Eating and drinking      Take an ORS (oral rehydration solution). This is a drink that is sold at pharmacies and stores.  Drink clear fluids in small amounts as you are able. These include: ? Water. ? Ice chips. ? Fruit juice that has water added (diluted fruit juice). ? Low-calorie sports drinks.  Eat bland, easy-to-digest foods in small amounts as you are able, such as: ? Bananas. ? Applesauce. ? Rice. ? Low-fat (lean) meats. ? Toast. ? Crackers.  Avoid drinking fluids that have a lot of sugar or caffeine in them. This includes energy drinks, sports drinks, and soda.  Avoid alcohol.  Avoid spicy or fatty foods. General instructions  Take over-the-counter and prescription medicines only as told by your doctor.  Rest at home while you get better.  Drink enough fluid to keep your pee (urine) pale yellow.  Take slow and deep breaths when you feel sick to your stomach.  Avoid food or things that have strong smells.  Wash your hands often with soap and water. If you cannot use soap and water, use hand sanitizer.  Make sure that all people in your home wash their hands well and often.  Keep all follow-up visits as told by your doctor. This is important. Contact a doctor if:  You feel sicker to your stomach.  You feel sick to your stomach for more than 2 days.  You throw up.  You are not able to drink fluids without throwing up.  You  have new symptoms.  You have a fever.  You have a headache.  You have muscle cramps.  You have a rash.  You have pain while peeing.  You feel light-headed or dizzy. Get help right away if:  You have pain in your chest, neck, arm, or jaw.  You feel very weak or you pass out (faint).  You have throw up that is bright red or looks like coffee grounds.  You have bloody or black poop (stools) or poop that looks like tar.  You have a very bad headache, a stiff neck,  or both.  You have very bad pain, cramping, or bloating in your belly (abdomen).  You have trouble breathing or you are breathing very quickly.  Your heart is beating very quickly.  Your skin feels cold and clammy.  You feel confused.  You have signs of losing too much water in your body, such as: ? Dark pee, very little pee, or no pee. ? Cracked lips. ? Dry mouth. ? Sunken eyes. ? Sleepiness. ? Weakness. These symptoms may be an emergency. Do not wait to see if the symptoms will go away. Get medical help right away. Call your local emergency services (911 in the [place] Do not drive yourself to the hospital. Summary  Nausea is feeling sick to your stomach or feeling that you are about to throw up (vomit).  If you throw up, or if you are not able to drink enough fluids, there is a risk that you may lose too much water in your body (get dehydrated).  Eat and drink what your doctor tells you. Take over-the-counter and prescription medicines only as told by your doctor.  Contact a doctor right away if your symptoms get worse or you have new symptoms.  Keep all follow-up visits as told by your doctor. This is important. This information is not intended to replace advice given to you by your health care provider. Make sure you discuss any questions you have with your health care provider. Document Released: 08/01/2011 Document Revised: 01/20/2018 Document Reviewed: 01/20/2018 Elsevier Patient Education  2020 [employer].

## 2019-03-16 NOTE — Progress Notes (Addendum)
Patient ID: Christina Marshall DOB: 1996-05-11 AGE: 23 y.o. MRN: [id number]   PCP: Patient, No Pcp Per   Chief Complaint:  Chief Complaint  Patient presents with  . Nausea    X 1 WEEK     Subjective:    HPI:  Christina Marshall is a 23 y.o. female presents for evaluation  Chief Complaint  Patient presents with  . Nausea    X 2 WEEK    23 year old female presents to [facility] with one week history of nausea. Began as mild and intermittent. Has gradually worsened. As of Friday 7/17, four days ago, nausea has become more severe and constant. Two days ago had diffuse abdominal pain. Describes as uncomfortable. Cramping. Most comfortable in lying flat position. Has been able to stay hydrated; sipping on water and water with lemon. Urinating regularly; pale yellow in color. Has not had much appetite. Has been eating applesauce. Having regular bowel movements. Associated mild constipation/bloating feeling and dull low back pain/ache. LMP just under 4 weeks ago, due to get period in 4 days. No previous pregnancy. Denies fever, chills, sweats, dizziness/lightheadedness, headache, URI symptoms, chest pain, SOB, wheezing, vomiting, hematochezia, dysuria, increased urinary frequency, urinary urgency, gross hematuria, vaginal rash/discharge/pruritis, diarrhea. No known precipitating food. No recent use of NSAIDs, alcohol intake, spicy foods. No new medications. No close contacts (co-workers, friends, family members) with similar symptoms. No previous history of similar symptoms; history of ovarian cysts, current pain not as severe. No previous history of pyelonephritis or nephrolithiasis.  Patient called PCP. Has appointment Mon August 3rd, in two weeks - states was earliest available appt. Was advised to have urine pregnancy test performed and be prescribed something for nausea.  A limited review of symptoms was performed, pertinent positives and negatives as mentioned in  HPI.  The following portions of the patient's history were reviewed and updated as appropriate: allergies, current medications and past medical history.  There are no active problems to display for this patient.   No Known Allergies  Current Outpatient Medications on File Prior to Visit  Medication Sig Dispense Refill  . ibuprofen (ADVIL,MOTRIN) 600 MG tablet Take 1 tablet (600 mg total) by mouth every 6 (six) hours as needed. (Patient not taking: Reported on 03/16/2019) 30 tablet 0   No current facility-administered medications on file prior to visit.        Objective:   Vitals:   03/16/19 1425  BP: 110/65  Pulse: 99  Resp: 16  Temp: 98.6 F (37 C)  SpO2: 99%     Wt Readings from Last 3 Encounters:  03/16/19 154 lb (69.9 kg)  08/11/17 156 lb 11.2 oz (71.1 kg)  07/21/17 158 lb 9.6 oz (71.9 kg)    Physical Exam:   General Appearance:  Patient sitting comfortably on examination table. Conversational. Kermit Balo self-historian. In no acute distress. Afebrile. Not antalgic; moving comfortably.  Head:  Normocephalic, without obvious abnormality, atraumatic  Eyes:  PERRL, conjunctiva/corneas clear, EOM's intact  Ears:  Left ear canal WNL. No erythema or edema. No open wound. No visible purulent drainage. No tenderness with palpation over left tragus or with manipulation of left auricle. No visible erythema or edema of left mastoid. No tenderness with palpation over left mastoid. Right ear canal WNL. No erythema or edema. No open wound. No visible purulent drainage. No tenderness with palpation over right tragus or with manipulation of right auricle. No visible erythema or edema of right mastoid. No tenderness with palpation over right  mastoid. Left TM WNL. Good light reflex. Visible landmarks. No erythema. No injection. No bulging or retraction. No visible perforation. No serous effusion. No visible purulent effusion. No tympanostomy tube. No scar tissue. Right TM WNL. Good light  reflex. Visible landmarks. No erythema. No injection. No bulging or retraction. No visible perforation. No serous effusion. No visible purulent effusion. No tympanostomy tube. No scar tissue.  Nose: Nares normal. Septum midline. No visible polyps. No discharge. Normal mucosa. No sinus tenderness with percussion/palpation.  Throat: Lips, mucosa, and tongue normal; teeth and gums normal. Throat reveals no erythema. No postnasal drip. No visible cobblestoning. Tonsils with no enlargement or exudate. Uvula midline with no edema or erythema.  Neck: Supple, symmetrical, trachea midline, no adenopathy  Lungs:   Clear to auscultation bilaterally, respirations unlabored. Good aeration. No rales, rhonchi, crackles or wheezing.  Heart:  Regular rate and rhythm, S1 and S2 normal, no murmur, rub, or gallop  Abdomen:   Normal to inspection. Borborygmus bowel sounds. Minimal discomfort in left periumbilical area. No guarding, rigidity or rebound tenderness. No palpable organomegaly. No CVA tenderness with percussion bilaterally.  Extremities: Extremities normal, atraumatic, no cyanosis or edema  Pulses: 2+ and symmetric  Skin: Skin color, texture, turgor normal, no rashes or lesions  Lymph nodes: Cervical, supraclavicular, and axillary nodes normal  Neurologic: Normal    Assessment & Plan:    Exam findings, diagnosis etiology and medication use and indications reviewed with patient. Follow-Up and discharge instructions provided. No emergent/urgent issues found on exam.  Patient education was provided.   Patient verbalized understanding of information provided and agrees with plan of care (POC), all questions answered. The patient is advised to call or return to clinic if condition does not see an improvement in symptoms, or to seek the care of the closest emergency department if condition worsens with the below plan.   Recent Results (from the past 2160 hour(s))  POCT urinalysis dipstick     Status: Normal    Collection Time: 03/16/19  3:03 PM  Result Value Ref Range   Color, UA yellow    Clarity, UA clear    Glucose, UA Negative Negative   Bilirubin, UA negative    Ketones, UA large    Spec Grav, UA 1.010 1.010 - 1.025   Blood, UA negative    pH, UA 6.0 5.0 - 8.0   Protein, UA Negative Negative   Urobilinogen, UA 0.2 0.2 or 1.0 E.U./dL   Nitrite, UA negative    Leukocytes, UA Negative Negative   Appearance     Odor    POCT urine pregnancy     Status: Abnormal   Collection Time: 03/16/19  3:03 PM  Result Value Ref Range   Preg Test, Ur Positive (A) Negative    1. Nausea - POCT urinalysis dipstick - POCT urine pregnancy  2. Acute bilateral low back pain without sciatica  3. Pregnancy, unspecified gestational age  Patient presents with one week history of nausea. Associated mild low back pain/ache and abdominal bloating. VSS, afebrile, in no acute distress, benign abdominal exam. UA positive for ketones (no indication of UTI). Urine pregnancy positive. Suspect pregnancy cause of nausea. Prescribed Zofran for nausea; discussed risks vs benefits in regards to pregnancy. Advised continued fluids and bland diet. Advised follow-up with Ob/Gyn; in the meantime taking an OTC prenatal vitamin and no alcohol. Advised immediate evaluation at the ED if nausea worsens, patient develops fever, vomiting, chest pain, SOB, abdominal pain, worsening back pain, or other new/concerning  symptom. Patient agrees with plan.  Addendum 7/23: Patient called and requested a work excuse note for yesterday and today. Completed communication. Emailed to patient.   Darlin Priestly, MHS, PA-C Montey Hora, MHS, PA-C Advanced Practice Provider [facility]  [address], [place] [address] (p): [phone] [email or web address]

## 2019-03-17 DIAGNOSIS — E559 Vitamin D deficiency, unspecified: Secondary | ICD-10-CM | POA: Insufficient documentation

## 2019-03-18 ENCOUNTER — Inpatient Hospital Stay: Payer: No Typology Code available for payment source | Admitting: Family Medicine

## 2019-03-18 ENCOUNTER — Telehealth: Payer: Self-pay | Admitting: Emergency Medicine

## 2019-03-18 NOTE — Telephone Encounter (Signed)
Left message following up on visit with [facility] 

## 2019-04-05 ENCOUNTER — Ambulatory Visit: Payer: Self-pay | Admitting: Women's Health

## 2019-04-10 ENCOUNTER — Other Ambulatory Visit: Payer: Self-pay

## 2019-04-10 ENCOUNTER — Encounter: Payer: Self-pay | Admitting: Student

## 2019-04-10 ENCOUNTER — Inpatient Hospital Stay (HOSPITAL_COMMUNITY)
Admission: AD | Admit: 2019-04-10 | Discharge: 2019-04-10 | Disposition: A | Discharge status: Home / Self Care | Payer: No Typology Code available for payment source | Attending: Obstetrics and Gynecology | Admitting: Obstetrics and Gynecology

## 2019-04-10 DIAGNOSIS — Z3A10 10 weeks gestation of pregnancy: Secondary | ICD-10-CM | POA: Insufficient documentation

## 2019-04-10 DIAGNOSIS — O98811 Other maternal infectious and parasitic diseases complicating pregnancy, first trimester: Secondary | ICD-10-CM | POA: Diagnosis not present

## 2019-04-10 DIAGNOSIS — O26891 Other specified pregnancy related conditions, first trimester: Secondary | ICD-10-CM

## 2019-04-10 DIAGNOSIS — R109 Unspecified abdominal pain: Secondary | ICD-10-CM | POA: Insufficient documentation

## 2019-04-10 DIAGNOSIS — B373 Candidiasis of vulva and vagina: Secondary | ICD-10-CM | POA: Diagnosis not present

## 2019-04-10 DIAGNOSIS — B3731 Acute candidiasis of vulva and vagina: Secondary | ICD-10-CM

## 2019-04-10 LAB — URINALYSIS, ROUTINE W REFLEX MICROSCOPIC
Bilirubin Urine: NEGATIVE
Glucose, UA: NEGATIVE mg/dL
Hgb urine dipstick: NEGATIVE
Ketones, ur: NEGATIVE mg/dL
Nitrite: NEGATIVE
Protein, ur: NEGATIVE mg/dL
Specific Gravity, Urine: 1.005 (ref 1.005–1.030)
pH: 6 (ref 5.0–8.0)

## 2019-04-10 LAB — WET PREP, GENITAL
Clue Cells Wet Prep HPF POC: NONE SEEN
Sperm: NONE SEEN
Trich, Wet Prep: NONE SEEN

## 2019-04-10 MED ORDER — TERCONAZOLE 0.8 % VA CREA: 1.0000 | TOPICAL_CREAM | Freq: Every day | VAGINAL | 0 refills | Status: DC

## 2019-04-10 NOTE — MAU Provider Note (Signed)
Chief Complaint: Abdominal Pain   First Provider Initiated Contact with Patient 04/10/19 1010     SUBJECTIVE HPI: Christina Marshall is a 23 y.o. G1P0000 at [redacted]w[redacted]d who presents to [facility] reporting abdominal pain. Symptoms started a few weeks ago but worsened in the last 3 days. Reports intermittent shooting pains & cramping from her umbilicus down. Had some nausea & constipation that has resolved. Has had increase in vaginal discharge that is thick & yellow. Initially has some itching/irritation but that resolved. Denies dysuria, fever, or vaginal bleeding. Has been in off/on relationship with FOB for the last year; no other sex partners. Has not started prenatal care.   Location: abdomen Quality: sharp, cramping Severity: 6/10 on pain scale Duration: 2 weeks Timing: intermittent Modifying factors: worse when sleeping Associated signs and symptoms: vaginal discharge  Past Medical History:  Diagnosis Date  . Anxiety    H/O 2011  . Complication of anesthesia   . History of pneumonia as a child  . PONV (postoperative nausea and vomiting)    OB History  Gravida Para Term Preterm AB Living  1 0 0 0 0 0  SAB TAB Ectopic Multiple Live Births  0 0 0 0 0    # Outcome Date GA Lbr Len/2nd Weight Sex Delivery Anes PTL Lv  1 Current             Obstetric Comments  Menstrual age: 23       Past Surgical History:  Procedure Laterality Date  . ear cyst removal Left   . EXCISION MASS NECK N/A 01/28/2017   Procedure: EXCISION MASS NECK;  Surgeon: Christene Lye, MD;  Location: [facility] ORS;  Service: General;  Laterality: N/A;  . MOUTH SURGERY     Social History   Socioeconomic History  . Marital status: Single    Spouse name: Not on file  . Number of children: Not on file  . Years of education: Not on file  . Highest education level: Not on file  Occupational History  . Not on file  Social Needs  . Financial resource strain: Not on file  . Food insecurity     Worry: Not on file    Inability: Not on file  . Transportation needs    Medical: Not on file    Non-medical: Not on file  Tobacco Use  . Smoking status: Never Smoker  . Smokeless tobacco: Never Used  Substance and Sexual Activity  . Alcohol use: Yes    Comment: OCC  . Drug use: No  . Sexual activity: Not on file  Lifestyle  . Physical activity    Days per week: Not on file    Minutes per session: Not on file  . Stress: Not on file  Relationships  . Social Herbalist on phone: Not on file    Gets together: Not on file    Attends religious service: Not on file    Active member of club or organization: Not on file    Attends meetings of clubs or organizations: Not on file    Relationship status: Not on file  . Intimate partner violence    Fear of current or ex partner: Not on file    Emotionally abused: Not on file    Physically abused: Not on file    Forced sexual activity: Not on file  Other Topics Concern  . Not on file  Social History Narrative  . Not on file   Family  History  Problem Relation Age of Onset  . Bipolar disorder Mother    No current facility-administered medications on file prior to encounter.    Current Outpatient Medications on File Prior to Encounter  Medication Sig Dispense Refill  . ondansetron (ZOFRAN-ODT) 4 MG disintegrating tablet Take 1 tablet (4 mg total) by mouth every 8 (eight) hours as needed for nausea or vomiting. 20 tablet 0   No Known Allergies  I have reviewed patient's Past Medical Hx, Surgical Hx, Family Hx, Social Hx, medications and allergies.   Review of Systems  Constitutional: Negative.   Gastrointestinal: Positive for abdominal pain.  Genitourinary: Positive for vaginal discharge. Negative for dysuria and vaginal bleeding.    OBJECTIVE Patient Vitals for the past 24 hrs:  BP Temp Temp src Pulse Resp SpO2  04/10/19 1136 122/72 - - 74 18 -  04/10/19 0947 126/75 97.9 F (36.6 C) Oral 95 18 99 %    Constitutional: Well-developed, well-nourished female in no acute distress.  Cardiovascular: normal rate & rhythm, no murmur Respiratory: normal rate and effort. Lung sounds clear throughout GI: Abd soft, non-tender, Pos BS x 4. No guarding or rebound tenderness MS: Extremities nontender, no edema, normal ROM Neurologic: Alert and oriented x 4.  GU:     SPECULUM EXAM: NEFG, large amount of clumpy green tinged discharge. Cervix not friable  BIMANUAL: No CMT. cervix closed; uterus enlarged ~10 wks size, no adnexal tenderness or masses.    LAB RESULTS Results for orders placed or performed during the hospital encounter of 04/10/19 (from the past 24 hour(s))  Urinalysis, Routine w reflex microscopic     Status: Abnormal   Collection Time: 04/10/19  9:27 AM  Result Value Ref Range   Color, Urine STRAW (A) YELLOW   APPearance CLEAR CLEAR   Specific Gravity, Urine 1.005 1.005 - 1.030   pH 6.0 5.0 - 8.0   Glucose, UA NEGATIVE NEGATIVE mg/dL   Hgb urine dipstick NEGATIVE NEGATIVE   Bilirubin Urine NEGATIVE NEGATIVE   Ketones, ur NEGATIVE NEGATIVE mg/dL   Protein, ur NEGATIVE NEGATIVE mg/dL   Nitrite NEGATIVE NEGATIVE   Leukocytes,Ua SMALL (A) NEGATIVE   RBC / HPF 0-5 0 - 5 RBC/hpf   WBC, UA 0-5 0 - 5 WBC/hpf   Bacteria, UA RARE (A) NONE SEEN   Squamous Epithelial / LPF 0-5 0 - 5  Wet prep, genital     Status: Abnormal   Collection Time: 04/10/19 10:50 AM   Specimen: Cervix  Result Value Ref Range   Yeast Wet Prep HPF POC PRESENT (A) NONE SEEN   Trich, Wet Prep NONE SEEN NONE SEEN   Clue Cells Wet Prep HPF POC NONE SEEN NONE SEEN   WBC, Wet Prep HPF POC FEW (A) NONE SEEN   Sperm NONE SEEN     IMAGING No results found.  MAU COURSE Orders Placed This Encounter  Procedures  . Wet prep, genital  . Urinalysis, Routine w reflex microscopic  . Discharge patient   Meds ordered this encounter  Medications  . terconazole (TERAZOL 3) 0.8 % vaginal cream    Sig: Place 1 applicator  vaginally at bedtime.    Dispense:  20 g    Refill:  0    Order Specific Question:   Supervising Provider    Answer:   CONSTANT, PEGGY [[id number]]    MDM FHT present via doppler Cervix closed  Discharge on exam consistent with yeast infection.  GC/CT & wet prep collected  Wet prep positive  for yeast  ASSESSMENT 1. Vaginal yeast infection   2. [redacted] weeks gestation of pregnancy   3. Abdominal pain during pregnancy in first trimester     PLAN Discharge home in stable condition. Discussed reasons to return to MAU Start prenatal care - given a list of providers GC/CT pending Rx terazol  [facility] Follow up.   Specialty: Obstetrics and Gynecology Why: return for worsening symptoms Contact information: [address], [place] [phone] [place] [phone]         Allergies as of 04/10/2019   No Known Allergies     Medication List    TAKE these medications   ondansetron 4 MG disintegrating tablet Commonly known as: ZOFRAN-ODT Take 1 tablet (4 mg total) by mouth every 8 (eight) hours as needed for nausea or vomiting.   terconazole 0.8 % vaginal cream Commonly known as: Terazol 3 Place 1 applicator vaginally at bedtime.        Jorje Guild, NP 04/10/2019  12:38 PM

## 2019-04-10 NOTE — MAU Note (Signed)
Pt reports lower abd. Pain for past week.  Pt states pain is sometimes sharp and other times crampy.  Pt denies vag bleeding.

## 2019-04-10 NOTE — Discharge Instructions (Signed)
[place] [facility] Providers ° ° °[facility]       Phone: [phone] ° °[facility] Phone: [phone] ° °[facility] [place]  Phone: [phone] ° °[facility] [place]  Phone: [phone] ° °[facility]  Phone: [phone] ° °[facility]       Phone: [phone] ° °[facility]    Phone: [phone]  ° °[facility] ([place]    Phone: [phone] ° °[facility]    Phone: [phone] ° °[facility]    Phone: [phone]  ° °[facility]  Phone: [phone] ° ° [facility]    Phone: [phone] ° °[facility]   Phone: [phone] ° °[facility]    Phone: [phone] ° °

## 2019-04-13 LAB — GC/CHLAMYDIA PROBE AMP (~~LOC~~) NOT AT ARMC
Chlamydia: NEGATIVE
Neisseria Gonorrhea: NEGATIVE

## 2019-04-23 ENCOUNTER — Other Ambulatory Visit: Payer: Self-pay

## 2019-04-23 MED ORDER — TERCONAZOLE 0.8 % VA CREA: 1.0000 | TOPICAL_CREAM | Freq: Every day | VAGINAL | 0 refills | Status: AC

## 2019-05-14 ENCOUNTER — Encounter: Payer: Self-pay | Admitting: Certified Nurse Midwife
